# Patient Record
Sex: Female | Born: 1960
Health system: Southern US, Community
[De-identification: ages and names within clinical notes are randomized; demographics above are authoritative.]

## PROBLEM LIST (undated history)

## (undated) DIAGNOSIS — E119 Type 2 diabetes mellitus without complications: Secondary | ICD-10-CM

## (undated) DIAGNOSIS — I10 Essential (primary) hypertension: Secondary | ICD-10-CM

## (undated) HISTORY — DX: Essential (primary) hypertension: I10

---

## 2015-02-14 ENCOUNTER — Encounter (HOSPITAL_COMMUNITY): Payer: Self-pay | Admitting: *Deleted

## 2015-02-14 ENCOUNTER — Emergency Department (HOSPITAL_COMMUNITY): Payer: Self-pay

## 2015-02-14 ENCOUNTER — Emergency Department (HOSPITAL_COMMUNITY)
Admission: EM | Admit: 2015-02-14 | Discharge: 2015-02-14 | Disposition: A | Discharge status: Home / Self Care | Payer: Self-pay | Attending: Emergency Medicine | Admitting: Emergency Medicine

## 2015-02-14 DIAGNOSIS — X58XXXA Exposure to other specified factors, initial encounter: Secondary | ICD-10-CM | POA: Insufficient documentation

## 2015-02-14 DIAGNOSIS — R1012 Left upper quadrant pain: Secondary | ICD-10-CM | POA: Insufficient documentation

## 2015-02-14 DIAGNOSIS — S29011A Strain of muscle and tendon of front wall of thorax, initial encounter: Secondary | ICD-10-CM | POA: Insufficient documentation

## 2015-02-14 DIAGNOSIS — R059 Cough, unspecified: Secondary | ICD-10-CM

## 2015-02-14 DIAGNOSIS — Z791 Long term (current) use of non-steroidal anti-inflammatories (NSAID): Secondary | ICD-10-CM | POA: Insufficient documentation

## 2015-02-14 DIAGNOSIS — Y939 Activity, unspecified: Secondary | ICD-10-CM | POA: Insufficient documentation

## 2015-02-14 DIAGNOSIS — R05 Cough: Secondary | ICD-10-CM | POA: Insufficient documentation

## 2015-02-14 DIAGNOSIS — Y999 Unspecified external cause status: Secondary | ICD-10-CM | POA: Insufficient documentation

## 2015-02-14 DIAGNOSIS — Y929 Unspecified place or not applicable: Secondary | ICD-10-CM | POA: Insufficient documentation

## 2015-02-14 LAB — CBC WITH DIFFERENTIAL/PLATELET
BASOS ABS: 0 10*3/uL (ref 0.0–0.1)
BASOS PCT: 0 % (ref 0–1)
EOS ABS: 0.2 10*3/uL (ref 0.0–0.7)
EOS PCT: 4 % (ref 0–5)
HCT: 38.8 % (ref 36.0–46.0)
HEMOGLOBIN: 12.7 g/dL (ref 12.0–15.0)
LYMPHS ABS: 2.2 10*3/uL (ref 0.7–4.0)
Lymphocytes Relative: 44 % (ref 12–46)
MCH: 27.6 pg (ref 26.0–34.0)
MCHC: 32.7 g/dL (ref 30.0–36.0)
MCV: 84.3 fL (ref 78.0–100.0)
Monocytes Absolute: 0.5 10*3/uL (ref 0.1–1.0)
Monocytes Relative: 9 % (ref 3–12)
NEUTROS PCT: 43 % (ref 43–77)
Neutro Abs: 2.2 10*3/uL (ref 1.7–7.7)
PLATELETS: 244 10*3/uL (ref 150–400)
RBC: 4.6 MIL/uL (ref 3.87–5.11)
RDW: 13.1 % (ref 11.5–15.5)
WBC: 5 10*3/uL (ref 4.0–10.5)

## 2015-02-14 LAB — COMPREHENSIVE METABOLIC PANEL
ALBUMIN: 4.3 g/dL (ref 3.5–5.0)
ALK PHOS: 68 U/L (ref 38–126)
ALT: 31 U/L (ref 14–54)
AST: 20 U/L (ref 15–41)
Anion gap: 6 (ref 5–15)
BUN: 14 mg/dL (ref 6–20)
CALCIUM: 8.9 mg/dL (ref 8.9–10.3)
CHLORIDE: 106 mmol/L (ref 101–111)
CO2: 26 mmol/L (ref 22–32)
CREATININE: 0.63 mg/dL (ref 0.44–1.00)
GFR calc non Af Amer: >60 mL/min (ref >60)
GLUCOSE: 193 mg/dL — AB (ref 65–99)
Potassium: 4 mmol/L (ref 3.5–5.1)
SODIUM: 138 mmol/L (ref 135–145)
Total Bilirubin: 0.6 mg/dL (ref 0.3–1.2)
Total Protein: 7 g/dL (ref 6.5–8.1)

## 2015-02-14 LAB — URINALYSIS, ROUTINE W REFLEX MICROSCOPIC
Bilirubin Urine: NEGATIVE
GLUCOSE, UA: 100 mg/dL — AB
Ketones, ur: NEGATIVE mg/dL
LEUKOCYTES UA: NEGATIVE
Nitrite: NEGATIVE
PROTEIN: NEGATIVE mg/dL
SPECIFIC GRAVITY, URINE: 1.01 (ref 1.005–1.030)
UROBILINOGEN UA: 0.2 mg/dL (ref 0.0–1.0)
pH: 6 (ref 5.0–8.0)

## 2015-02-14 LAB — URINE MICROSCOPIC-ADD ON

## 2015-02-14 LAB — LIPASE, BLOOD: Lipase: 22 U/L (ref 22–51)

## 2015-02-14 MED ORDER — METHOCARBAMOL 500 MG PO TABS: 500.0000 mg | ORAL_TABLET | Freq: Two times a day (BID) | ORAL | Status: DC

## 2015-02-14 MED ORDER — METHOCARBAMOL 500 MG PO TABS
500.0000 mg | ORAL_TABLET | Freq: Once | ORAL | Status: AC
  Administered 2015-02-14: 500 mg via ORAL
  Filled 2015-02-14: qty 1

## 2015-02-14 MED ORDER — BENZONATATE 100 MG PO CAPS: 100.0000 mg | ORAL_CAPSULE | Freq: Three times a day (TID) | ORAL | Status: DC

## 2015-02-14 MED ORDER — NAPROXEN 500 MG PO TABS: 500.0000 mg | ORAL_TABLET | Freq: Two times a day (BID) | ORAL | Status: DC

## 2015-02-14 NOTE — Discharge Instructions (Signed)
1. Medications: robaxin, naprosyn, tessalon, usual home medications 2. Treatment: rest, drink plenty of fluids, advance diet slowly 3. Follow Up: Please followup with your primary doctor in 2 days for discussion of your diagnoses and further evaluation after today's visit; if you do not have a primary care doctor use the resource guide provided to find one; Please return to the ER for persistent vomiting, high fevers or worsening symptoms    Tos - Adultos  (Cough, Adult)  La tos es un reflejo que ayuda a limpiar las vas areas y Administrator. Puede ayudar a curar el organismo o ser Neomia Dear reaccin a un irritante. La tos puede durar [employer] 2  3 semanas (aguda) o puede durar ms de 8 semanas (crnica)  CAUSAS  Tos aguda:   Infecciones virales o bacterianas. Tos crnica.   Infecciones.  Alergias.  Asma.  Goteo post nasal.  El hbito de fumar.  Acidez o reflujo gstrico.  Algunos medicamentos.  Problemas pulmonares crnicos  Cncer. SNTOMAS   Tos.  Grant Ruts.  Dolor en el pecho.  Aumento en el ritmo respiratorio.  Ruidos agudos al respirar (sibilancias).  Moco coloreado al toser (esputo). TRATAMIENTO   Un tos de causa bacteriana puede tratarse con antibiticos.  La tos de origen viral debe seguir su curso y no responde a los antibiticos.  El mdico podr recomendar otros tratamientos si tiene tos crnica. INSTRUCCIONES PARA EL CUIDADO EN EL HOGAR   Solo tome medicamentos que se pueden comprar sin receta o recetados para Chief Technology Officer, Dentist o fiebre, como le indica el mdico. Utilice antitusivos slo en la forma indicada por el mdico.  Use un vaporizador o humidificador de niebla fra en la habitacin para ayudar a aflojar las secreciones.  Duerma en posicin semi erguida si la tos empeora por la noche.  Descanse todo lo que pueda.  Si fuma, abandone el hbito. SOLICITE ATENCIN MDICA DE INMEDIATO SI:   Observa pus en el esputo.  La tos empeora.  No puede  controlar la tos con antitusivos y no puede dormir debido a Secretary/administrator.  Comienza a escupir sangre al toser.  Tiene dificultad para respirar.  El dolor empeora o no puede controlarlo con los medicamentos.  Tiene fiebre. ASEGRESE DE QUE:   Comprende estas instrucciones.  Controlar su enfermedad.  Solicitar ayuda de inmediato si no mejora o si empeora. Document Released: 01/07/2011 Document Revised: 08/24/2011 [facility] Patient Information 2015 [place], [place]. This information is not intended to replace advice given to you by your health care provider. Make sure you discuss any questions you have with your health care provider.   Emergency Department Resource Guide 1) Find a Doctor and Pay Out of Pocket Although you won't have to find out who is covered by your insurance plan, it is a good idea to ask around and get recommendations. You will then need to call the office and see if the doctor you have chosen will accept you as a new patient and what types of options they offer for patients who are self-pay. Some doctors offer discounts or will set up payment plans for their patients who do not have insurance, but you will need to ask so you aren't surprised when you get to your appointment.  2) Contact Your Local Health Department Not all health departments have doctors that can see patients for sick visits, but many do, so it is worth a call to see if yours does. If you don't know where your local health department is, you can  check in your phone book. The CDC also has a tool to help you locate your state's health department, and many state websites also have listings of all of their local health departments.  3) Find a Walk-in Clinic If your illness is not likely to be very severe or complicated, you may want to try a walk in clinic. These are popping up all over the country in pharmacies, drugstores, and shopping centers. They're usually staffed by nurse practitioners or physician assistants  that have been trained to treat common illnesses and complaints. They're usually fairly quick and inexpensive. However, if you have serious medical issues or chronic medical problems, these are probably not your best option.  No Primary Care Doctor: - Call Health Connect at  [phone] - they can help you locate a primary care doctor that  accepts your insurance, provides certain services, etc. - Physician Referral Service- [phone]  Chronic Pain Problems: Organization         Address  Phone   Notes  [facility]  [phone] Patients need to be referred by their primary care doctor.   Medication Assistance: Organization         Address  Phone   Notes  [facility] Medication [facility] [address] [place] [place], [place] [address] [phone] --Must be a resident of [facility] -- Must have NO insurance coverage whatsoever (no Medicaid/ Medicare, etc.) -- The pt. MUST have a primary care doctor that directs their care regularly and follows them in the community   MedAssist  [phone]   [employer]  [phone]    Agencies that provide inexpensive medical care: Organization         Address  Phone   Notes  [facility]  [phone]   [facility]    [phone]   [facility] [address] [place], [place] [address] [phone]   [facility] [address] [place] [place] [phone]   Planned Parenthood    [phone]   [facility]    [phone]   [facility]  [address], [place] Phone:  [phone], Fax:  [phone] Hours of Operation:  9 am - 6 pm, M-F.  Also accepts Medicaid/Medicare and self-pay.  [facility]  [address], [place] Phone: [phone], Fax: [phone]. Hours of Operation:  8:30 am - 5:30 pm, M-F.  Also accepts Medicaid  and self-pay.  [facility] [place] [address] [place] Phone: [phone]   [facility] [address] Natasha Bence [place], [place] [phone], Ext. [phone] Mondays & Thursdays: 7-9 AM.  First 15 patients are seen on a first come, first serve basis.    Medicaid-accepting [facility] Providers:  Organization         Address  Phone   Notes  [facility] [address], [place] [phone] Also accepts self-pay patients.  [facility] [address] Laurell Josephs [place], [place]  [phone]   [facility] [address], [place] [phone]   [facility] [address] [place] [phone]   Renaye Rakers [address], [place]   [phone] Only accepts [place] Access [place] patients after they have their name applied to  their card.   Self-Pay (no insurance) in [facility]:  Organization         Address  Phone   Notes  Sickle Cell Patients, [facility] Internal Medicine [address] [place], [place] [phone]   [facility] [address] [place], [place] [phone]   [facility] [place]  [address], [place] [phone]   [facility]/Dr. Osei-Bonsu  [address], [place] or [address], [place] [phone] Phone number for both [place] and [place] locations is the same.  [facility] [address], [place] [phone]   [facility] [address] [place] or [address] [phone]   [facility] [address], [place] [phone], phone; [phone], fax Sees patients 1st and 3rd Saturday of every month.  Must not qualify for public or private insurance (i.e. Medicaid, Medicare, [place] Health Choice, Veterans' Benefits)  Household income  should be no more than 200% of the poverty level The clinic cannot treat you if you are pregnant or think you are pregnant  Sexually transmitted diseases are not treated at the clinic.    Dental Care: Organization         Address  Phone  Notes  [facility] Department of [facility] [address] [place], [place] [phone] Accepts children up to age 34 who are enrolled in [place] or [place] Health Choice; pregnant women with a Medicaid card; and children who have applied for Medicaid or [place] Health Choice, but were declined, whose parents can pay a reduced fee at time of service.  [facility] Department of [facility]  [address], [place] [phone] Accepts children up to age 66 who are enrolled in [place] or [place] Health Choice; pregnant women with a Medicaid card; and children who have applied for Medicaid or [place] Health Choice, but were declined, whose parents can pay a reduced fee at time of service.  [facility]  [address] [place], [place] [phone] Patients are seen by appointment only. Walk-ins are not accepted. [facility] will see patients 19 years of age and older. Monday - Tuesday (8am-5pm) Most Wednesdays (8:30-5pm) $30 per visit, cash only  [facility] Adult Dental Access PROGRAM  [address], [facility] [phone] Patients are seen by appointment only. Walk-ins are not accepted. [facility] will see patients 50 years of age and older. One Wednesday Evening (Monthly: Volunteer Based).  $30 per visit, cash only  [employer]  [phone] for adults; Children under age 34, call Graduate Pediatric Dentistry at [phone]. Children aged 45-14, please call [phone] to request a pediatric application.  Dental services are provided in all areas of dental care including fillings, crowns and bridges, complete and partial dentures, implants, gum treatment,  root canals, and extractions. Preventive care is also provided. Treatment is provided to both adults and children. Patients are selected via a lottery and there is often a waiting list.   [facility] [address], [place]  [phone] [email or web address]   [facility] [address] [place], [place] [phone], Ext. [phone] Second and Fourth Thursday of each month, opens at 6:30 AM; Clinic ends at 9 AM.  Patients are seen on a first-come first-served basis,  and a limited number are seen during each clinic.   [facility]  [address] Ether Griffins [place], [place] [phone]   Eligibility Requirements You must have lived in [place], [place], or [place] counties for at least the last three months.   You cannot be eligible for state or federal sponsored [place], including [employer], [place], or [employer].   You generally cannot be eligible for healthcare insurance through your employer.    How to apply: Eligibility screenings are held every Tuesday and Wednesday afternoon from 1:00 pm until 4:00 pm. You do not need an appointment for the interview!  [facility] [address] [place], [place] [phone]   [facility] Health Department  [phone]   [facility] Health Department  [phone]   [facility] Health Department  [phone]    Behavioral Health Resources in the Community: Intensive Outpatient Programs Organization         Address  Phone  Notes  [facility] Services [address], [place], [place] [phone]   [facility] Outpatient [address] [place], [place] [phone]   [facility] [address], [place], [place]  [phone]   [facility] Mental Health [address]  [place], [place] [phone] or [phone]   Substance Abuse Resources Organization         Address  Phone  Notes  [facility]   [phone]   [facility]  [phone]   [facility]  [phone]   Floydene Flock  [phone]   Residential & Outpatient Substance Abuse Program  [phone]   Psychological Services Organization         Address  Phone  Notes  [facility] Behavioral Health  [phone]   [facility] Services  [phone]   [facility] Mental Health [address], [place] [phone] or [phone]    Mobile Crisis Teams Organization         Address  Phone  Notes  [facility], Mobile Crisis Care Unit  [phone]   [facility]  [address] [place], [place] [phone]   Doristine Locks [address] [place] [phone]    Self-Help/Support Groups Organization         Address  Phone             Notes  [facility] [place] - variety of support groups  336- I7437963 Call for more information  Narcotics Anonymous (NA), [facility] [address], [employer] [place]  2 meetings at this location   Statistician         Address  Phone  Notes  [facility] [address],    [place]  [phone]   [facility]  [address], [place] [address], [place], [place] [phone]   [facility] [address] [place], [place] [phone] Admissions: 8am-3pm M-F  [facility] [address]    [place], [place] [phone]   [facility] [address] Starling Manns [place], [place] [phone]   [facility] [address]  [place], [place] [phone]   [facility] - Intensive Outpatient [address] Laurell Josephs [address], [place], [place] [phone]   [facility] ([facility] [address] [place].,  [place], [place] [phone] or [phone]   [facility] ([facility]) [address], [place], [place] [phone] Accepts Medicaid  [facility]ellowship  [address],  [place] [phone] Substance Abuse/Addiction Treatment   [facility] Organization         Address  Phone  Notes  [facility]  [phone]   Angie Fava, PhD [address] Ervin Knack [place], [place]   [phone] or [phone]   [facility] Behavioral   [address] [place], [place] [phone]   [facility] [address] [place], [place] [phone] Insurance/Medicaid/sponsorship through [facility] [address]                                    [place], [place] [phone] Therapy/tele-psych/case  [facility] [address], [place] [phone]    Dr. Lolly Mustache  [phone]   [facility]  [employer] [facility] Dept. 1) [address], [place] 2) [address], [place] 3)  [address], [place] [phone]  [phone]   [place] Child Abuse Hotline 714-202-0308 or [phone] (After Hours)

## 2015-02-14 NOTE — ED Notes (Signed)
Nurse currently starting IV 

## 2015-02-14 NOTE — ED Notes (Addendum)
Pt complains of LUQ abdominal pain since Saturday. Pt denies n/v/d. Pt has taken naproxen for pain, which provided some relief. Pt states the pain is worse with movement. Pt states the pain started after the patient had been coughing more over the weekend.

## 2015-02-14 NOTE — ED Notes (Signed)
Patient given Sprite and crackers for po challenge.

## 2015-02-14 NOTE — ED Notes (Signed)
WENT TO COLLECT LAPS RN STATED THEY WOULD COLLECT AS THEY WERE DOING AN IV

## 2015-02-14 NOTE — ED Notes (Signed)
Patient transported to X-ray 

## 2015-02-14 NOTE — Progress Notes (Signed)
CM spoke with pt who confirms uninsured [employer] resident with no pcp.  CM discussed and provided written spanish information for uninsured accepting pcps, discussed the importance of pcp vs EDP services for f/u care, www.needymeds.org, www.goodrx.com, discounted pharmacies and other [employer] such as [employer] , [employer], affordable care act, financial assistance, uninsured dental services, [place] med assist, DSS and  health department  Reviewed resources for [employer] uninsured accepting pcps like Jovita Kussmaul, family medicine at [employer], [facility] [place], [facility], local urgent care centers, [facility], [facility] family practice, general medical clinics, family services of the [place], [facility] urgent care plus others, medication resources, [facility] out patient pharmacies and housing Pt voiced understanding and appreciation of resources provided   Provided [facility] contact information Pt agreed to a referral Cm completed referral Pt to be contact by [facility] clinical liason

## 2015-02-14 NOTE — ED Provider Notes (Signed)
CSN: [id number]     Arrival date & time 02/14/15  1229 History   First MD Initiated Contact with Patient 02/14/15 1232     Chief Complaint  Patient presents with  . Abdominal Pain     (Consider location/radiation/quality/duration/timing/severity/associated sxs/prior Treatment) Patient is a 54 y.o. female presenting with abdominal pain. The history is provided by the patient and medical records. A language interpreter was used (Patient's daughter).  Abdominal Pain Associated symptoms: no chest pain, no constipation, no cough, no diarrhea, no dysuria, no fatigue, no fever, no hematuria, no nausea, no shortness of breath and no vomiting      Deborah Lyons is a 54 y.o. female  with no major medical history presents to the Emergency Department complaining of gradual, persistent, progressively worsening left flank and left upper abdominal pain onset 5 days ago with associated cough. Pt's daughter reports that LUQ abd pain and left-sided rib pain began after 24 hours of cough.  She reports the patient has taken Naproxyn with relief of the pain, but pain returns after it wears off.  Patient denies fevers, chills, decreased appetite, nausea, vomiting, diarrhea, weakness, dizziness, syncope, dysuria, hematuria, vaginal discharge.  Patient also denies chest pain, dyspnea on exertion, shortness of breath, recent travel, tick bites, abdominal surgeries, melanoma, hematochezia.  Patient's daughter is at bedside translating.  History reviewed. No pertinent past medical history. History reviewed. No pertinent past surgical history. No family history on file. Social History  Substance Use Topics  . Smoking status: Never Smoker   . Smokeless tobacco: None  . Alcohol Use: No   OB History    No data available     Review of Systems  Constitutional: Negative for fever, diaphoresis, appetite change, fatigue and unexpected weight change.  HENT: Negative for mouth sores.   Eyes: Negative for visual  disturbance.  Respiratory: Negative for cough, chest tightness, shortness of breath and wheezing.   Cardiovascular: Negative for chest pain.  Gastrointestinal: Positive for abdominal pain (LUQ). Negative for nausea, vomiting, diarrhea and constipation.  Endocrine: Negative for polydipsia, polyphagia and polyuria.  Genitourinary: Negative for dysuria, urgency, frequency and hematuria.  Musculoskeletal: Negative for back pain and neck stiffness.  Skin: Negative for rash.  Allergic/Immunologic: Negative for immunocompromised state.  Neurological: Negative for syncope, light-headedness and headaches.  Hematological: Does not bruise/bleed easily.  Psychiatric/Behavioral: Negative for sleep disturbance. The patient is not nervous/anxious.       Allergies  Review of patient's allergies indicates not on file.  Home Medications   Prior to Admission medications   Medication Sig Start Date End Date Taking? Authorizing Provider  acetaminophen (TYLENOL) 500 MG tablet Take 1,000 mg by mouth every 6 (six) hours as needed.   Yes Historical Provider, MD  naproxen (NAPROSYN) 250 MG tablet Take 500 mg by mouth 2 (two) times daily with a meal.   Yes Historical Provider, MD   BP 142/71 mmHg  Pulse 66  Temp(Src) 97.7 F (36.5 C) (Oral)  Resp 20  SpO2 97% Physical Exam  Constitutional: She appears well-developed and well-nourished. No distress.  Awake, alert, nontoxic appearance  HENT:  Head: Normocephalic and atraumatic.  Mouth/Throat: Oropharynx is clear and moist. No oropharyngeal exudate.  Eyes: Conjunctivae are normal. No scleral icterus.  Neck: Normal range of motion. Neck supple.  Cardiovascular: Normal rate, regular rhythm, normal heart sounds and intact distal pulses.   Pulmonary/Chest: Effort normal and breath sounds normal. No respiratory distress. She has no wheezes. She exhibits tenderness.  Equal chest  expansion Mild TTP of the left chest wall over ribs 9-13 in the anterior and  lateral   Abdominal: Soft. Bowel sounds are normal. She exhibits no distension and no mass. There is tenderness in the left upper quadrant. There is no rebound and no guarding.    Musculoskeletal: Normal range of motion. She exhibits no edema.  Neurological: She is alert.  Speech is clear and goal oriented Moves extremities without ataxia  Skin: Skin is warm and dry. She is not diaphoretic.  Psychiatric: She has a normal mood and affect.  Nursing note and vitals reviewed.   ED Course  Procedures (including critical care time) Labs Review Labs Reviewed  COMPREHENSIVE METABOLIC PANEL - Abnormal; Notable for the following:    Glucose, Bld 193 (*)    All other components within normal limits  URINALYSIS, ROUTINE W REFLEX MICROSCOPIC (NOT AT [facility]) - Abnormal; Notable for the following:    Glucose, UA 100 (*)    Hgb urine dipstick TRACE (*)    All other components within normal limits  URINE MICROSCOPIC-ADD ON - Abnormal; Notable for the following:    Bacteria, UA FEW (*)    All other components within normal limits  CBC WITH DIFFERENTIAL/PLATELET  LIPASE, BLOOD    Imaging Review Dg Chest 2 View  02/14/2015   CLINICAL DATA:  Cough for 1 month. Left upper quadrant pain since Saturday. History of bronchitis. Nonsmoker.  EXAM: CHEST  2 VIEW  COMPARISON:  None.  FINDINGS: Patient rotated to the right. Midline trachea. Normal heart size and mediastinal contours. No pleural effusion or pneumothorax. Clear lungs.  IMPRESSION: No acute cardiopulmonary disease.   Electronically Signed   By: Jeronimo Greaves M.D.   On: 02/14/2015 13:32   I have personally reviewed and evaluated these images and lab results as part of my medical decision-making.   EKG Interpretation None      MDM   Final diagnoses:  Chest wall muscle strain, initial encounter  Cough  LUQ abdominal pain   Deborah Lyons presents with LUQ abd pain and left rib pain after coughing.  Will check basic labs and chest  x-ray. Will give muscle relaxer and reassess.  Patient is without tachycardia, hypotension, hypoxia or fever.  Patient denies reflux symptoms.  3:24 PM Labs are reassuring and without leukocytosis. No evidence of pancreatitis. No right upper quadrant abdominal pain and no lab evidence of cholecystitis.  No splenomegaly on exam. Urinalysis without evidence of urinary tract infection.  His x-ray without evidence of pneumothorax, rib fracture, pleural effusion or pneumonia.  Pain is likely secondary to muscle strain from her persistent coughing. Will give muscle relaxer, anti-inflammatory cell cough medicine. Discussed at length with patient and family the importance of having a primary care and follow-up. Patient given resources and information about the [facility]  BP 142/71 mmHg  Pulse 66  Temp(Src) 97.7 F (36.5 C) (Oral)  Resp 20  SpO2 97%    Deborah Forth, PA-C 02/14/15 1526  Deborah Grizzle, MD 02/15/15 [phone]

## 2016-04-07 ENCOUNTER — Ambulatory Visit (HOSPITAL_COMMUNITY)
Admission: EM | Admit: 2016-04-07 | Discharge: 2016-04-07 | Disposition: A | Discharge status: Home / Self Care | Payer: Self-pay | Attending: Family Medicine | Admitting: Family Medicine

## 2016-04-07 ENCOUNTER — Ambulatory Visit (INDEPENDENT_AMBULATORY_CARE_PROVIDER_SITE_OTHER): Payer: Self-pay

## 2016-04-07 ENCOUNTER — Encounter (HOSPITAL_COMMUNITY): Payer: Self-pay | Admitting: Emergency Medicine

## 2016-04-07 DIAGNOSIS — S93492A Sprain of other ligament of left ankle, initial encounter: Secondary | ICD-10-CM

## 2016-04-07 NOTE — ED Triage Notes (Signed)
[facility] with a complaint of left ankle pain that started this morning secondary to missing a step and rolling her ankle.

## 2016-04-07 NOTE — ED Provider Notes (Signed)
CSN: [id number]     Arrival date & time 04/07/16  1652 History   First MD Initiated Contact with Patient 04/07/16 1746     Chief Complaint  Patient presents with  . Ankle Pain   (Consider location/radiation/quality/duration/timing/severity/associated sxs/prior Treatment) HPI NP 55 Y/O FEMALE WITH LEFT ANKLE INJURY AFTER MISSING A STEP CAUSING HER TO ROLL HER ANKLE. PAIN AND SWELLING. HOME TREATMENT ICE, ELEVATION AND REST.  History reviewed. No pertinent past medical history. History reviewed. No pertinent surgical history. History reviewed. No pertinent family history. Social History  Substance Use Topics  . Smoking status: Never Smoker  . Smokeless tobacco: Never Used  . Alcohol use No   OB History    No data available     Review of Systems  Denies: HEADACHE, NAUSEA, ABDOMINAL PAIN, CHEST PAIN, CONGESTION, DYSURIA, SHORTNESS OF BREATH  Allergies  Review of patient's allergies indicates no known allergies.  Home Medications   Prior to Admission medications   Medication Sig Start Date End Date Taking? Authorizing Provider  acetaminophen (TYLENOL) 500 MG tablet Take 1,000 mg by mouth every 6 (six) hours as needed.    Historical Provider, MD  benzonatate (TESSALON) 100 MG capsule Take 1 capsule (100 mg total) by mouth every 8 (eight) hours. 02/14/15   Hannah Muthersbaugh, PA-C  methocarbamol (ROBAXIN) 500 MG tablet Take 1 tablet (500 mg total) by mouth 2 (two) times daily. 02/14/15   Hannah Muthersbaugh, PA-C  naproxen (NAPROSYN) 500 MG tablet Take 1 tablet (500 mg total) by mouth 2 (two) times daily with a meal. 02/14/15   Hannah Muthersbaugh, PA-C   Meds Ordered and Administered this Visit  Medications - No data to display  BP 143/76 (BP Location: Right Arm)   Pulse 71   Temp 98.3 F (36.8 C) (Oral)   Resp 20   SpO2 100%  No data found.   Physical Exam NURSES NOTES AND VITAL SIGNS REVIEWED. CONSTITUTIONAL: Well developed, well nourished, no acute distress HEENT:  normocephalic, atraumatic EYES: Conjunctiva normal NECK:normal ROM, supple, no adenopathy PULMONARY:No respiratory distress, normal effort ABDOMINAL: Soft, ND, NT BS+, No CVAT MUSCULOSKELETAL: Normal ROM of all extremities, LEFT ANKLE WITH SOME SWELLING LATERALLY, NO 5MTT. SKIN: warm and dry without rash PSYCHIATRIC: Mood and affect, behavior are normal  Urgent Care Course   Clinical Course    Procedures (including critical care time)  Labs Review Labs Reviewed - No data to display  Imaging Review Dg Ankle Complete Left  Result Date: 04/07/2016 CLINICAL DATA:  Left ankle pain since a twisting injury going down stairs earlier today. Initial encounter. EXAM: LEFT ANKLE COMPLETE - 3+ VIEW COMPARISON:  None. FINDINGS: There is no evidence of fracture, dislocation, or joint effusion. There is no evidence of arthropathy or other focal bone abnormality. Small calcaneal spurs are noted. Soft tissues are unremarkable. IMPRESSION: Negative exam. Electronically Signed   By: Drusilla Kannerhomas  Dalessio M.D.   On: 04/07/2016 18:00     Visual Acuity Review  Right Eye Distance:   Left Eye Distance:   Bilateral Distance:    Right Eye Near:   Left Eye Near:    Bilateral Near:         MDM   1. Sprain of anterior talofibular ligament of left ankle, initial encounter     Patient is reassured that there are no issues that require transfer to higher level of care at this time or additional tests. Patient is advised to continue home symptomatic treatment. Patient is advised that if there are  new or worsening symptoms to attend the emergency department, contact primary care provider, or return to UC. Instructions of care provided discharged home in stable condition.    THIS NOTE WAS GENERATED USING A VOICE RECOGNITION SOFTWARE PROGRAM. ALL REASONABLE EFFORTS  WERE MADE TO PROOFREAD THIS DOCUMENT FOR ACCURACY.  I have verbally reviewed the discharge instructions with the patient. A printed AVS was  given to the patient.  All questions were answered prior to discharge.     Tharon Aquas, PA 04/07/16 1816

## 2016-04-16 ENCOUNTER — Encounter (HOSPITAL_COMMUNITY): Payer: Self-pay | Admitting: Emergency Medicine

## 2016-04-16 ENCOUNTER — Ambulatory Visit (HOSPITAL_COMMUNITY)
Admission: EM | Admit: 2016-04-16 | Discharge: 2016-04-16 | Disposition: A | Discharge status: Home / Self Care | Payer: Self-pay | Attending: Emergency Medicine | Admitting: Emergency Medicine

## 2016-04-16 DIAGNOSIS — N644 Mastodynia: Secondary | ICD-10-CM

## 2016-04-16 MED ORDER — NAPROXEN 500 MG PO TABS: 500.0000 mg | ORAL_TABLET | Freq: Two times a day (BID) | ORAL | 0 refills | Status: DC

## 2016-04-16 NOTE — ED Provider Notes (Signed)
[facility]    CSN: [id number] Arrival date & time: 04/16/16  1839     History   Chief Complaint Chief Complaint  Patient presents with  . Breast Pain    HPI Deborah Lyons is a 55 y.o. female.   HPI  She is a 55 year old woman here for evaluation of left nipple pain. Her daughter is present and acts as interpreter.  For the last month she has had intermittent pains in the left nipple. No discharge. No skin changes. No masses appreciated. No fevers. Her last mammogram was at least 3 years ago.  History reviewed. No pertinent past medical history.  There are no active problems to display for this patient.   History reviewed. No pertinent surgical history.  OB History    No data available       Home Medications    Prior to Admission medications   Medication Sig Start Date End Date Taking? Authorizing Provider  acetaminophen (TYLENOL) 500 MG tablet Take 1,000 mg by mouth every 6 (six) hours as needed.    Historical Provider, MD  naproxen (NAPROSYN) 500 MG tablet Take 1 tablet (500 mg total) by mouth 2 (two) times daily. 04/16/16   Charm RingsErin J Evian Derringer, MD    Family History History reviewed. No pertinent family history.  Social History Social History  Substance Use Topics  . Smoking status: Never Smoker  . Smokeless tobacco: Never Used  . Alcohol use No     Allergies   Review of patient's allergies indicates no known allergies.   Review of Systems Review of Systems As in history of present illness  Physical Exam Triage Vital Signs ED Triage Vitals  Enc Vitals Group     BP 04/16/16 1948 169/61     Pulse Rate 04/16/16 1948 65     Resp 04/16/16 1948 16     Temp 04/16/16 1948 98.2 F (36.8 C)     Temp Source 04/16/16 1948 Oral     SpO2 04/16/16 1948 100 %     Weight --      Height --      Head Circumference --      Peak Flow --      Pain Score 04/16/16 1953 3     Pain Loc --      Pain Edu? --      Excl. in GC? --    No data  found.   Updated Vital Signs BP 169/61 (BP Location: Left Arm)   Pulse 65   Temp 98.2 F (36.8 C) (Oral)   Resp 16   SpO2 100%   Visual Acuity Right Eye Distance:   Left Eye Distance:   Bilateral Distance:    Right Eye Near:   Left Eye Near:    Bilateral Near:     Physical Exam  Constitutional: She is oriented to person, place, and time. She appears well-developed and well-nourished. No distress.  Cardiovascular: Normal rate.   Pulmonary/Chest: Effort normal. Left breast exhibits tenderness. Left breast exhibits no inverted nipple, no mass, no nipple discharge and no skin change.    Neurological: She is alert and oriented to person, place, and time.     UC Treatments / Results  Labs (all labs ordered are listed, but only abnormal results are displayed) Labs Reviewed - No data to display  EKG  EKG Interpretation None       Radiology No results found.  Procedures Procedures (including critical care time)  Medications Ordered in  UC Medications - No data to display   Initial Impression / Assessment and Plan / UC Course  I have reviewed the triage vital signs and the nursing notes.  Pertinent labs & imaging results that were available during my care of the patient were reviewed by me and considered in my medical decision making (see chart for details).  Clinical Course    Prescription for Naprosyn given to use as needed for pain. I placed a future order for a mammogram. Patient is to call the breast center tomorrow to set up an appointment. If there are any difficulties, they should call the urgent care.  Final Clinical Impressions(s) / UC Diagnoses   Final diagnoses:  Nipple pain    New Prescriptions New Prescriptions   NAPROXEN (NAPROSYN) 500 MG TABLET    Take 1 tablet (500 mg total) by mouth 2 (two) times daily.     Charm RingsErin J Wylma Tatem, MD 04/16/16 2021

## 2016-04-16 NOTE — Discharge Instructions (Signed)
Take Naprosyn twice a day as needed to help with pain. I have set up a mammogram at the breast center. Please call them tomorrow to set up a time.  If there is any problem, please have them contact the urgent care at [phone].

## 2016-04-16 NOTE — ED Triage Notes (Signed)
Here for intermittent pain on left breast onset 3 days  Reports pain increases w/pressure  Denies nipple drainage, breast mass, fevers  Also c/o dry cough onset x5 days  A&O x4... NAD

## 2016-05-06 ENCOUNTER — Other Ambulatory Visit (HOSPITAL_COMMUNITY): Payer: Self-pay | Admitting: *Deleted

## 2016-05-06 DIAGNOSIS — N644 Mastodynia: Secondary | ICD-10-CM

## 2016-06-04 ENCOUNTER — Ambulatory Visit
Admission: RE | Admit: 2016-06-04 | Discharge: 2016-06-04 | Disposition: A | Discharge status: Home / Self Care | Payer: No Typology Code available for payment source | Source: Ambulatory Visit | Attending: Obstetrics and Gynecology | Admitting: Obstetrics and Gynecology

## 2016-06-04 ENCOUNTER — Ambulatory Visit (HOSPITAL_COMMUNITY)
Admission: RE | Admit: 2016-06-04 | Discharge: 2016-06-04 | Disposition: A | Discharge status: Home / Self Care | Payer: Self-pay | Source: Ambulatory Visit | Attending: Obstetrics and Gynecology | Admitting: Obstetrics and Gynecology

## 2016-06-04 ENCOUNTER — Encounter (HOSPITAL_COMMUNITY): Payer: Self-pay

## 2016-06-04 ENCOUNTER — Other Ambulatory Visit: Payer: Self-pay | Admitting: Obstetrics and Gynecology

## 2016-06-04 VITALS — BP 162/98 | Temp 98.2°F | Ht 60.0 in | Wt 151.4 lb

## 2016-06-04 DIAGNOSIS — N644 Mastodynia: Secondary | ICD-10-CM

## 2016-06-04 DIAGNOSIS — Z1239 Encounter for other screening for malignant neoplasm of breast: Secondary | ICD-10-CM

## 2016-06-04 DIAGNOSIS — Z1231 Encounter for screening mammogram for malignant neoplasm of breast: Secondary | ICD-10-CM

## 2016-06-04 NOTE — Patient Instructions (Signed)
Explained breast self awareness to Deborah EdingerMaria Avina de Lyons. Patient did not need a Pap smear today due to last Pap smear was in February 2017 per patient. Let her know BCCCP will cover Pap smears every 3 years unless has a history of abnormal Pap smears. [facility]. Appointment scheduled for Thursday, June 04, 2016 at 1430. Let patient know the Breast Center will follow up with her within the next couple weeks with results of mammogram by letter or phone. Deborah EdingerMaria Avina de Lyons verbalized understanding.  Rossy Virag, Kathaleen Maserhristine Poll, RN 2:49 PM

## 2016-06-04 NOTE — Progress Notes (Signed)
No complaints today.   Pap Smear:  Pap smear not completed today. Last Pap smear was in February 2017 at the free [facility] cancer screening [facility] and normal per patient. Per patient has no history of an abnormal Pap smear. No Pap smear results are in EPIC.  Physical exam: Breasts Breasts symmetrical. No skin abnormalities bilateral breasts. No nipple retraction bilateral breasts. No nipple discharge bilateral breasts. No lymphadenopathy. No lumps palpated bilateral breasts. No complaints of pain or tenderness on exam. [facility]. Appointment scheduled for Thursday, June 04, 2016 at 1430.        Pelvic/Bimanual No Pap smear completed today since last Pap smear was in February 2017 per patient. Pap smear not indicated per BCCCP guidelines.   Smoking History: Patient has never smoked.  Patient Navigation: Patient education provided. Access to services provided for patient through [facility] program. Spanish interpreter provided.  Colorectal Cancer Screening: Per patient has never had a colonoscopy completed. No complaints today.  Used Spanish interpreter [employer] Herrera from [facility].

## 2016-06-19 ENCOUNTER — Encounter (HOSPITAL_COMMUNITY): Payer: Self-pay | Admitting: *Deleted

## 2017-06-11 ENCOUNTER — Other Ambulatory Visit: Payer: Self-pay | Admitting: Obstetrics and Gynecology

## 2017-06-11 DIAGNOSIS — Z1231 Encounter for screening mammogram for malignant neoplasm of breast: Secondary | ICD-10-CM

## 2017-07-13 ENCOUNTER — Ambulatory Visit (HOSPITAL_COMMUNITY)
Admission: RE | Admit: 2017-07-13 | Discharge: 2017-07-13 | Disposition: A | Discharge status: Home / Self Care | Payer: No Typology Code available for payment source | Source: Ambulatory Visit | Attending: Obstetrics and Gynecology | Admitting: Obstetrics and Gynecology

## 2017-07-13 ENCOUNTER — Ambulatory Visit
Admission: RE | Admit: 2017-07-13 | Discharge: 2017-07-13 | Disposition: A | Discharge status: Home / Self Care | Payer: No Typology Code available for payment source | Source: Ambulatory Visit | Attending: Obstetrics and Gynecology | Admitting: Obstetrics and Gynecology

## 2017-07-13 ENCOUNTER — Encounter (HOSPITAL_COMMUNITY): Payer: Self-pay

## 2017-07-13 VITALS — BP 120/72 | Wt 142.0 lb

## 2017-07-13 DIAGNOSIS — Z1239 Encounter for other screening for malignant neoplasm of breast: Secondary | ICD-10-CM

## 2017-07-13 DIAGNOSIS — Z1231 Encounter for screening mammogram for malignant neoplasm of breast: Secondary | ICD-10-CM

## 2017-07-13 NOTE — Patient Instructions (Addendum)
Explained breast self awareness with [name]. Patient did not need a Pap smear today due to last Pap smear was in February 2017 per patient. Let her know BCCCP will cover Pap smears every 3 years unless has a history of abnormal Pap smears. [facility] screening mamm[facility]. Appointment scheduled for Tuesday, July 13, 2017 at 1400. Let patient know the Breast Center will follow up with her within the next couple weeks with results of mammogram by letter or phone. [name] verbalized understanding.  Josalin Carneiro, Kathaleen Maserhristine Poll, RN 12:40 PM

## 2017-07-13 NOTE — Progress Notes (Signed)
Patient complained of left breast pain that comes and goes for past 14-15 months. Patient stated she was having the pain prior to her last mammogram but did not have pain the day of her mammogram. Patient rates the pain at a 1-3 out of 10.  Pap Smear: Pap smear not completed today. Last Pap smear was in February 2017 at the free [facility] cancer screening [facility] and normal per patient. Per patient has no history of an abnormal Pap smear. No Pap smear results are in EPIC.  Physical exam: Breasts Breasts symmetrical. No skin abnormalities bilateral breasts. No nipple retraction bilateral breasts. No nipple discharge bilateral breasts. No lymphadenopathy. No lumps palpated bilateral breasts. No complaints of pain or tenderness on exam. [facility] mamm[facility]. Appointment scheduled for Tuesday, July 13, 2017 at 1400.       Pelvic/Bimanual No Pap smear completed today since last Pap smear was in February 2017 per patient. Pap smear not indicated per BCCCP guidelines.  Smoking History: Patient has never smoked.  Patient Navigation: Patient education provided. Access to services provided [facility]. Spanish interpreter provided.  Colorectal Cancer Screening: Per patient has never had a colonoscopy completed. No complaints today. FIT Test given to patient to complete and return to BCCCP.  Used Spanish interpreter [employer] McReynolds from [employer].

## 2017-07-14 ENCOUNTER — Encounter (HOSPITAL_COMMUNITY): Payer: Self-pay | Admitting: *Deleted

## 2018-05-05 ENCOUNTER — Other Ambulatory Visit (HOSPITAL_COMMUNITY): Payer: Self-pay | Admitting: *Deleted

## 2018-05-05 DIAGNOSIS — N644 Mastodynia: Secondary | ICD-10-CM

## 2018-06-30 ENCOUNTER — Ambulatory Visit: Payer: No Typology Code available for payment source

## 2018-06-30 ENCOUNTER — Ambulatory Visit
Admission: RE | Admit: 2018-06-30 | Discharge: 2018-06-30 | Disposition: A | Discharge status: Home / Self Care | Payer: No Typology Code available for payment source | Source: Ambulatory Visit | Attending: Obstetrics and Gynecology | Admitting: Obstetrics and Gynecology

## 2018-06-30 ENCOUNTER — Encounter (HOSPITAL_COMMUNITY): Payer: Self-pay

## 2018-06-30 ENCOUNTER — Encounter (HOSPITAL_COMMUNITY): Payer: Self-pay | Admitting: *Deleted

## 2018-06-30 ENCOUNTER — Other Ambulatory Visit: Payer: Self-pay | Admitting: Obstetrics and Gynecology

## 2018-06-30 ENCOUNTER — Ambulatory Visit (HOSPITAL_COMMUNITY)
Admission: RE | Admit: 2018-06-30 | Discharge: 2018-06-30 | Disposition: A | Discharge status: Home / Self Care | Payer: No Typology Code available for payment source | Source: Ambulatory Visit | Attending: Obstetrics and Gynecology | Admitting: Obstetrics and Gynecology

## 2018-06-30 VITALS — BP 122/80 | Wt 147.0 lb

## 2018-06-30 DIAGNOSIS — N644 Mastodynia: Secondary | ICD-10-CM

## 2018-06-30 DIAGNOSIS — Z1239 Encounter for other screening for malignant neoplasm of breast: Secondary | ICD-10-CM

## 2018-06-30 DIAGNOSIS — Z1231 Encounter for screening mammogram for malignant neoplasm of breast: Secondary | ICD-10-CM

## 2018-06-30 NOTE — Patient Instructions (Addendum)
Explained breast self awareness with Deborah Lyons. Patient did not need a Pap smear today due to last Pap smear was in February 2017 per patient. Let her know BCCCP will cover Pap smears every 3 years unless has a history of abnormal Pap smears. [facility] screening mamm[facility]. Appointment scheduled for Tuesday, July 19, 2018 at 1340. Patient scheduled for the free [facility] screening [facility] on Monday, August 08, 2018 at 1825. Patient aware of appointments and will be there. Deborah Lyons verbalized understanding.  Brannock, Kathaleen Maserhristine Poll, RN 8:40 AM

## 2018-06-30 NOTE — Progress Notes (Addendum)
No complaints today.   Pap Smear: Pap smear not completed today. Last Pap smear was in February 2017 at the free cervical cancer screening at [facility] and normal per patient. Per patient has no history of an abnormal Pap smear. Patient scheduled for the free cervical cancer screening at the [facility] cancer on Monday, August 08, 2018 at 1825. No Pap smear results are in EPIC.  Physical exam: Breasts Breasts symmetrical. No skin abnormalities bilateral breasts. No nipple retraction bilateral breasts. No nipple discharge bilateral breasts. No lymphadenopathy. No lumps palpated bilateral breasts. No complaints of pain or tenderness on exam. Referred patient to the [facility] for a screening mammogram. Appointment scheduled for Tuesday, July 19, 2018 at 1340.        Pelvic/Bimanual No Pap smear completed today since last Pap smear was in February 2017 per patient. Pap smear not indicated per BCCCP guidelines.    Smoking History: Patient has never smoked.  Patient Navigation: Patient education provided. Access to services provided for patient through [facility] program. Spanish interpreter provided.   Colorectal Cancer Screening: Per patient has never had a colonoscopy completed. No complaints today. FIT Test given to patient to complete and return to BCCCP.  Breast and Cervical Cancer Risk Assessment: Patient has a family history of her sister having breast cancer. Patient has no known genetic mutations or history of radiation treatment to the chest before age 63. Patient has no history of cervical dysplasia, immunocompromised, or DES exposure in-utero.  Risk Assessment    Risk Scores      06/30/2018   Last edited by: Lynnell Dike, LPN   5-year risk: 1.9 %   Lifetime risk: 10.4 %         Used Spanish interpreter Natale Lay from [place].

## 2018-06-30 NOTE — Addendum Note (Signed)
Encounter addended by: Priscille HeidelbergBrannock, Harrietta Incorvaia P, RN on: 06/30/2018 9:56 AM  Actions taken: Clinical Note Signed

## 2018-07-19 ENCOUNTER — Ambulatory Visit
Admission: RE | Admit: 2018-07-19 | Discharge: 2018-07-19 | Disposition: A | Discharge status: Home / Self Care | Payer: No Typology Code available for payment source | Source: Ambulatory Visit | Attending: Obstetrics and Gynecology | Admitting: Obstetrics and Gynecology

## 2018-07-19 ENCOUNTER — Other Ambulatory Visit: Payer: No Typology Code available for payment source

## 2018-07-19 DIAGNOSIS — Z1231 Encounter for screening mammogram for malignant neoplasm of breast: Secondary | ICD-10-CM

## 2018-07-22 ENCOUNTER — Other Ambulatory Visit: Payer: Self-pay

## 2018-07-24 LAB — SPECIMEN STATUS REPORT

## 2018-07-24 LAB — FECAL OCCULT BLOOD, IMMUNOCHEMICAL: FECAL OCCULT BLD: NEGATIVE

## 2018-07-25 ENCOUNTER — Encounter (HOSPITAL_COMMUNITY): Payer: Self-pay

## 2018-08-08 ENCOUNTER — Other Ambulatory Visit: Payer: Self-pay | Admitting: Nurse Practitioner

## 2018-08-08 DIAGNOSIS — Z124 Encounter for screening for malignant neoplasm of cervix: Secondary | ICD-10-CM

## 2018-08-08 NOTE — Addendum Note (Signed)
Addended by: Lynnell Dike on: 08/08/2018 07:31 PM   Modules accepted: Orders

## 2018-08-08 NOTE — Progress Notes (Signed)
Patient: Deborah Lyons           Date of Birth: 09-Jan-1961           MRN: [id number] Visit Date: 08/08/2018 PCP: Patient, No Pcp Per  Cervical Cancer Screening Do you smoke?: No Have you ever had or been told you have an allergy to latex products?: No Marital status: Single Date of last pap smear: Within the last year Date of last menstrual period: 06/15/13 Number of pregnancies: 4 Number of births: 4 Have you ever had any of the following? Hysterectomy: No Tubal ligation (tubes tied): No Abnormal bleeding: No Abnormal pap smear: No Venereal warts: No A sex partner with venereal warts: No A high risk* sex partner: No  Cervical Exam  Abnormal Observations: On first observation of cervix, seemed to have a cervical polyp in the center of the os, but then during the pap, the "polyp" area does not seem to be on a stalk but seems to be more attached posteriorly and so did not attempt removal. Recommendations: Follow up pap by guidelines.      Patient's History There are no active problems to display for this patient.  No past medical history on file.  Family History  Problem Relation Age of Onset  . Hypertension Mother   . Breast cancer Sister   . Stroke Brother     Past Surgical History:  Procedure Laterality Date  . CESAREAN SECTION     Social History   Occupational History  . Not on file  Tobacco Use  . Smoking status: Never Smoker  . Smokeless tobacco: Never Used  Substance and Sexual Activity  . Alcohol use: No  . Drug use: No  . Sexual activity: Not Currently    Birth control/protection: None

## 2018-08-10 LAB — CYTOLOGY - PAP: Diagnosis: NEGATIVE

## 2018-08-25 ENCOUNTER — Ambulatory Visit (HOSPITAL_COMMUNITY): Payer: No Typology Code available for payment source

## 2018-09-13 ENCOUNTER — Telehealth (HOSPITAL_COMMUNITY): Payer: Self-pay | Admitting: *Deleted

## 2018-09-13 NOTE — Telephone Encounter (Signed)
Called patient with Spanish interpreter Natale Lay from [facility] to discuss her cervical cancer screening results. Explained to patient that her Pap smear was normal and a cervical polyp was observed on exam. Patient has no insurance. Offered to refer patient to the [facility] [employer]. Told patient about the [facility] Financial assistance application. Patient would like a referral and verbalized understanding. Referral sent to the [facility] [employer].

## 2018-10-04 ENCOUNTER — Telehealth: Payer: Self-pay | Admitting: Student

## 2018-10-04 NOTE — Telephone Encounter (Signed)
Per the message in the inbox, the patient is to be scheduled for a cervicial polyp. Placing the patient on the wait list per the COVID19 restrictions.

## 2018-11-22 ENCOUNTER — Encounter: Payer: Self-pay | Admitting: *Deleted

## 2018-12-05 ENCOUNTER — Ambulatory Visit: Payer: No Typology Code available for payment source | Admitting: Obstetrics and Gynecology

## 2019-01-10 ENCOUNTER — Ambulatory Visit: Payer: Self-pay | Admitting: Obstetrics & Gynecology

## 2019-01-10 ENCOUNTER — Encounter: Payer: Self-pay | Admitting: Obstetrics & Gynecology

## 2019-01-10 ENCOUNTER — Other Ambulatory Visit: Payer: Self-pay

## 2019-01-10 VITALS — BP 157/83 | HR 76 | Wt 148.0 lb

## 2019-01-10 DIAGNOSIS — Z049 Encounter for examination and observation for unspecified reason: Secondary | ICD-10-CM

## 2019-01-10 NOTE — Progress Notes (Signed)
Spanish Interpreter Eda R. 

## 2019-01-10 NOTE — Progress Notes (Signed)
   Subjective:    Patient ID: Deborah Lyons, female    DOB: 04/30/1961, 58 y.o.   MRN: [id number]  HPI 58 yo lady sent from [facility] for a possible cervical polyp. She has no complaints today, denies PMB. Per records from [facility], she had a normal pap smear in 2017   Review of Systems     Objective:   Physical Exam Breathing, conversing, and ambulating normally Well nourished, well hydrated Latina, no apparent distress  Live Spanish interpretor used for exam Vulva and vagina with atrophy Cervix- atrophic with normal anatomy    Assessment & Plan:  Doing well.

## 2019-06-16 HISTORY — PX: OTHER SURGICAL HISTORY: SHX169

## 2019-09-13 ENCOUNTER — Other Ambulatory Visit: Payer: Self-pay

## 2019-09-13 DIAGNOSIS — Z1231 Encounter for screening mammogram for malignant neoplasm of breast: Secondary | ICD-10-CM

## 2019-10-10 ENCOUNTER — Ambulatory Visit: Payer: Self-pay

## 2019-11-07 ENCOUNTER — Other Ambulatory Visit: Payer: Self-pay

## 2019-11-07 ENCOUNTER — Ambulatory Visit: Payer: No Typology Code available for payment source | Admitting: *Deleted

## 2019-11-07 ENCOUNTER — Ambulatory Visit
Admission: RE | Admit: 2019-11-07 | Discharge: 2019-11-07 | Disposition: A | Discharge status: Home / Self Care | Payer: Self-pay | Source: Ambulatory Visit | Attending: Obstetrics and Gynecology | Admitting: Obstetrics and Gynecology

## 2019-11-07 VITALS — BP 150/82 | Temp 97.3°F | Wt 144.2 lb

## 2019-11-07 DIAGNOSIS — Z1239 Encounter for other screening for malignant neoplasm of breast: Secondary | ICD-10-CM

## 2019-11-07 DIAGNOSIS — Z1231 Encounter for screening mammogram for malignant neoplasm of breast: Secondary | ICD-10-CM

## 2019-11-07 NOTE — Progress Notes (Signed)
[name] is a 59 y.o. G4P0 female who presents to [facility] clinic today with complaints of pain between her breasts when touched. Patient rates the pain at a 2 out of 10.   Pap Smear: Pap smear completed today. Last Pap smear was 08/08/2018 at the free cervical cancer screening clinic sponsored by [facility] and was normal. Per patient has no history of an abnormal Pap smear. Last Pap smear result is in Epic.   Physical exam: Breasts Breasts symmetrical. No skin abnormalities bilateral breasts. No nipple retraction bilateral breasts. No nipple discharge bilateral breasts. No lymphadenopathy. No lumps palpated bilateral breasts. No complaints of pain or tenderness on exam.       Pelvic/Bimanual Ext Genitalia No lesions, no swelling and no discharge observed on external genitalia.        Vagina Vagina pink and normal texture. No lesions or discharge observed in vagina.        Cervix Cervix is present. Cervix pink and of normal texture. No discharge observed.    Uterus Uterus is present and palpable. Uterus in normal position and normal size.        Adnexae Bilateral ovaries present and palpable. No tenderness on palpation.         Rectovaginal No rectal exam completed today since patient had no rectal complaints. No skin abnormalities observed on exam.     Smoking History: Patient has never smoked.  Patient Navigation: Patient education provided. Access to services provided for patient through [place] program. Spanish interpreter Deborah Lyons from [facility] provided.   Colorectal Cancer Screening: Per patient has never had a colonoscopy completed. Patient completed a FIT Test 07/22/2018 that was negative. No complaints today.    Breast and Cervical Cancer Risk Assessment: Patient has a family history of her sister having breast cancer. Patient has no known genetic mutations or history of radiation treatment to the chest before age 6. Patient has no history  of cervical dysplasia, immunocompromised, or DES exposure in-utero.  Risk Assessment    Risk Scores      11/07/2019 06/30/2018   Last edited by: Priscille Heidelberg, RN Lynnell Dike, LPN   5-year risk: 2 % 1.9 %   Lifetime risk: 9.9 % 10.4 %           A: [facility] exam with pap smear   P: Referred patient to the [facility] for a screening mammogram. Appointment scheduled Tuesday, Nov 07, 2019 at 1430.  Priscille Heidelberg, RN 11/07/2019 1:51 PM

## 2019-11-07 NOTE — Patient Instructions (Signed)
Explained breast self awareness with Gwynne Edinger de Rangel. Patient did not need a Pap smear today due to last Pap smear was in 08/08/2018. Let her know [facility] will cover Pap smears every 3 years unless has a history of abnormal Pap smears. Referred patient to the [facility] for a screening mammogram. Appointment scheduled Tuesday, Nov 07, 2019 at 1430. Patient aware of appointment and will be there. Let patient know the Breast Center will follow up with her within the next couple weeks with results of her mammogram by letter or phone. Gwynne Edinger de Rangel verbalized understanding.  [place], Kathaleen Maser, RN 1:50 PM

## 2019-11-28 ENCOUNTER — Other Ambulatory Visit: Payer: Self-pay

## 2019-11-28 DIAGNOSIS — Z0000 Encounter for general adult medical examination without abnormal findings: Secondary | ICD-10-CM

## 2019-11-29 ENCOUNTER — Other Ambulatory Visit: Payer: Self-pay

## 2019-11-29 ENCOUNTER — Inpatient Hospital Stay: Payer: Self-pay | Attending: Obstetrics and Gynecology | Admitting: *Deleted

## 2019-11-29 VITALS — BP 150/92 | Ht 60.0 in | Wt 143.1 lb

## 2019-11-29 DIAGNOSIS — Z0000 Encounter for general adult medical examination without abnormal findings: Secondary | ICD-10-CM

## 2019-11-29 NOTE — Progress Notes (Signed)
[facility] initial screening   Interpreter- Natale Lay   Clinical Measurement:  Vitals:   11/29/19 0825 11/29/19 1024  BP: (!) 138/98 (!) 150/92   Fasting Labs Drawn Today, will review with patient when they result.   Medical History:  Patient states that she has high cholesterol, has high blood pressure and she does not have diabetes.  Medications:  Patient states that she does take medication to lower cholesterol, blood pressure and blood sugar.  Patient does not take an aspirin a day to help prevent a heart attack or stroke. During the past 7 days patient has taken prescribed medication to lower blood pressure on 0 days.   Blood pressure, self measurement: Patient states that she does not measure blood pressure from home. She checks her blood pressure N/A. She shares her readings with a health care provider: N/A.   Nutrition: Patient states that on average she eats 0 cups of fruit and 0 cups of vegetables per day. Patient states that she does not eat fish at least 2 times per week. Patient eats less than half servings of whole grains. Patient drinks less than 36 ounces of beverages with added sugar weekly: yes. Patient is currently watching sodium or salt intake: no. In the past 7 days patient has had 0 drinks containing alcohol. On average patient drinks 0 drinks containing alcohol per day.      Physical activity:  Patient states that she gets 0 minutes of moderate and 0 minutes of vigorous physical activity each week.  Smoking status:  Patient states that she has has never smoked .   Quality of life:  Over the past 2 weeks patient states that she had little interest or pleasure in doing things: several days. She has been feeling down, depressed or hopeless:several days.    Risk reduction and counseling:   Health Coaching: Patient stated that she was prescribed blood pressure medication by a doctor in [place]. Patient did not have the medication with her during visit and does not  know the name of the medication or the dose that she was prescribed. Patient stated that she has not taken the medication in the past month or so because she thought that it was bad to be on medication long term. Patient stated that the her son has told her that she needs to take the medication. Informed patient that I will be referring her to Internal Medicine for follow-up for elevated blood pressure. I also informed patient that if the doctor that she sees for follow-up prescribes her medication for her blood pressure that it is important to take the medication every day and at the same time every day. Patient verbalized understanding.  Encouraged patient to try and adds fruits and vegetables to daily diet. Patient currently does not eat fruits or vegetables every day. Explained to her that the recommendation is for 2 cups of fruit and 3 cups of vegetables every day. We also discussed the importance of whole grains and heart healthy fish  Gave patient examples of different foods she can try from these food groups. Patient also stated that she does not watch the amount of salt/sodium that she consumes. Encouraged her to try and cut back on the amount of salt she consumes because that can also elevate blood pressure. Lastly we talked about trying to get in a little daily exercise through walking.    Navigation:  I will notify patient of lab results.  Patient is aware of 2 more health coaching sessions  and a follow up. Will give patient follow-up appointment information for Internal Medicine once appointment has been scheduled.   Time: 30 minutes

## 2019-11-30 LAB — LIPID PANEL
Chol/HDL Ratio: 4 ratio (ref 0.0–4.4)
Cholesterol, Total: 255 mg/dL — ABNORMAL HIGH (ref 100–199)
HDL: 64 mg/dL (ref >39)
LDL Chol Calc (NIH): 157 mg/dL — ABNORMAL HIGH (ref 0–99)
Triglycerides: 189 mg/dL — ABNORMAL HIGH (ref 0–149)
VLDL Cholesterol Cal: 34 mg/dL (ref 5–40)

## 2019-11-30 LAB — HEMOGLOBIN A1C
Est. average glucose Bld gHb Est-mCnc: 203 mg/dL
Hgb A1c MFr Bld: 8.7 % — ABNORMAL HIGH (ref 4.8–5.6)

## 2019-11-30 LAB — GLUCOSE, RANDOM: Glucose: 187 mg/dL — ABNORMAL HIGH (ref 65–99)

## 2019-11-30 NOTE — Progress Notes (Signed)
Lab results

## 2019-12-07 ENCOUNTER — Telehealth: Payer: Self-pay

## 2019-12-07 NOTE — Telephone Encounter (Signed)
error 

## 2019-12-07 NOTE — Telephone Encounter (Signed)
Left patient a message via interpreter Natale Lay to discuss lab results from the [facility] program. Left name and number for patient to call back.

## 2019-12-11 ENCOUNTER — Telehealth: Payer: Self-pay

## 2019-12-11 ENCOUNTER — Telehealth (INDEPENDENT_AMBULATORY_CARE_PROVIDER_SITE_OTHER): Payer: Self-pay

## 2019-12-11 DIAGNOSIS — Z712 Person consulting for explanation of examination or test findings: Secondary | ICD-10-CM

## 2019-12-11 NOTE — Telephone Encounter (Signed)
Health coaching 2   interpreter- Natale Lay, [employer]   Labs- 255 cholesterol, 157 LDL cholesterol, 189 triglycerides, 64 HDL cholesterol, 8.7 hemoglobin A1C , 187 mean plasma glucose. Patient understands and is aware of her lab results.   Goals-  Discussed lab results with patient and answered any questions that patient had regarding results.   Reduce the amount of fried and fatty foods consumed. Try to grill, bake, broil or sautee foods instead. Reduce the amount of sweet and sugars consumed in both food and drinks. Reduce the amount of carbohydrates consumed. Try and start exercising for 20 minutes a day. We also discussed adding more fruits and vegetables in daily diet as well as whole grains and heart healthy fish.   Navigation:  Patient is aware of 1 more health coaching sessions and a follow up. Patient is scheduled with [facility] on Tuesday, June 29th @ 10:15 am for follow-up for elevated BP and labs.  Time- 20 minutes

## 2019-12-11 NOTE — Telephone Encounter (Signed)
Left message with patient's daughter about follow-up appointment information for her mother. Patient is scheduled with [facility] on Tuesday, June 29th @ 10:15 am. Left name and number if patient has anymore questions regarding appointment.

## 2019-12-11 NOTE — Telephone Encounter (Signed)
Pt's daughter left VM on nurse line requesting a call back regarding appt time.   Called pt with interpreter Alis. Daughter answered the phone and states her mother is at work and is not able to speak to [place] at this time. Requests information about upcoming appt. I explained that I do not know where her next appt is but that I will reach out to [facility] and ask them to contact the patient regarding follow up.

## 2019-12-12 ENCOUNTER — Ambulatory Visit (INDEPENDENT_AMBULATORY_CARE_PROVIDER_SITE_OTHER): Payer: Self-pay | Admitting: Internal Medicine

## 2019-12-12 VITALS — BP 137/67 | HR 63 | Temp 98.1°F | Ht 60.0 in | Wt 143.7 lb

## 2019-12-12 DIAGNOSIS — E785 Hyperlipidemia, unspecified: Secondary | ICD-10-CM

## 2019-12-12 DIAGNOSIS — E119 Type 2 diabetes mellitus without complications: Secondary | ICD-10-CM

## 2019-12-12 DIAGNOSIS — I10 Essential (primary) hypertension: Secondary | ICD-10-CM

## 2019-12-12 MED ORDER — METFORMIN HCL ER 500 MG PO TB24: 500.0000 mg | ORAL_TABLET | Freq: Every day | ORAL | 0 refills | Status: DC

## 2019-12-12 MED ORDER — ROSUVASTATIN CALCIUM 10 MG PO TABS: 10.0000 mg | ORAL_TABLET | Freq: Every day | ORAL | 0 refills | Status: DC

## 2019-12-12 MED ORDER — LOSARTAN POTASSIUM 50 MG PO TABS: 50.0000 mg | ORAL_TABLET | Freq: Every day | ORAL | 0 refills | Status: DC

## 2019-12-12 MED FILL — METFORMIN HCL ER 500 MG TB2: 500 | 30 days supply | Qty: 30 | Fill #0

## 2019-12-12 MED FILL — LOSARTAN POTASSIUM 50 MG TA: 50 | 30 days supply | Qty: 30 | Fill #0

## 2019-12-12 MED FILL — ROSUVASTATIN CALCIUM 10 MG: 10 | 30 days supply | Qty: 30 | Fill #0

## 2019-12-12 NOTE — Progress Notes (Signed)
New Patient Office Visit  Subjective:  Patient ID: Deborah Lyons, female    DOB: [birth year]  Age: 59 y.o. MRN: [id number]  CC: No chief complaint on file. Referral for abnormal lab work  HPI Gwynne Edinger de [place] female with PMHx as documented below, referred from [facility] for abnormal blood work (hemoglobin A1c and lipid profile).  Language interpreter assisted with this encounter. Please refer to problem based charting for further details and assessment and plan of current problem and chronic medical conditions.  Past Medical History:  Diagnosis Date  . Hypertension    Medications: Used to take Losartan 50 mg QD.  Past Surgical History:  Procedure Laterality Date  . arm surgery Left 06/2019   surgery to remove fat tissue (lump) done in [place]  . CESAREAN SECTION      Family History  Problem Relation Age of Onset  . Hypertension Mother   . Breast cancer Sister   . Stroke Brother    Social Hx: No smoking, no alcohol  Social History   Socioeconomic History  . Marital status: Married    Spouse name: Not on file  . Number of children: 4  . Years of education: Not on file  . Highest education level: 3rd grade  Occupational History  . Not on file  Tobacco Use  . Smoking status: Never Smoker  . Smokeless tobacco: Never Used  Vaping Use  . Vaping Use: Never used  Substance and Sexual Activity  . Alcohol use: No  . Drug use: No  . Sexual activity: Not Currently    Birth control/protection: Post-menopausal  Other Topics Concern  . Not on file  Social History Narrative  . Not on file   Social Determinants of Health   Financial Resource Strain:   . Difficulty of Paying Living Expenses:   Food Insecurity:   . Worried About Programme researcher, broadcasting/film/video in the Last Year:   . Barista in the Last Year:   Transportation Needs: No Transportation Needs  . Lack of Transportation (Medical): No  . Lack of Transportation (Non-Medical): No  Physical Activity:   .  Days of Exercise per Week:   . Minutes of Exercise per Session:   Stress:   . Feeling of Stress :   Social Connections:   . Frequency of Communication with Friends and Family:   . Frequency of Social Gatherings with Friends and Family:   . Attends Religious Services:   . Active Member of Clubs or Organizations:   . Attends Banker Meetings:   Marland Kitchen Marital Status:   Intimate Partner Violence:   . Fear of Current or Ex-Partner:   . Emotionally Abused:   Marland Kitchen Physically Abused:   . Sexually Abused:     ROS Review of Systems  Constitutional: Negative for chills, fatigue and fever.  HENT: Positive for hearing loss.   Respiratory: Negative for shortness of breath.   Gastrointestinal: Negative for diarrhea.   8.7 and patient referred for Objective:   Today's Vitals: BP 137/67 (BP Location: Right Arm, Patient Position: Sitting, Cuff Size: Small)   Pulse 63   Temp 98.1 F (36.7 C) (Oral)   Ht 5' (1.524 m)   Wt 143 lb 11.2 oz (65.2 kg)   SpO2 99%   BMI 28.06 kg/m   Physical Exam  Assessment & Plan:   Problem List Items Addressed This Visit      Cardiovascular and Mediastinum   Essential hypertension  This is the first visit at [facility].  Patient with history of hypertension, was supposed to be on lisinopril 50 mg daily (she mentions that it prescribed for her at [place] but she did not take it for several months until yesterday when she took 1 lisinopril tablet. Blood pressure today is 137/67. Encouraged to resume losartan. -Sent prescription for losartan 50 mg daily -Patient is applying for orange card. -Follow-up in [facility] in 1 month for blood pressure check and BMP      Relevant Medications   losartan (COZAAR) 50 MG tablet   rosuvastatin (CRESTOR) 10 MG tablet   Other Relevant Orders   BMP8+Anion Gap (Completed)     Endocrine   Type 2 diabetes mellitus without complication, without long-term current use of insulin (HCC) - Primary    This is a new diagnosis.  Screening lab advised woman showed hemoglobin A1c of 8.7 and patient [facility]. Patient denies any polydipsia, polyuria.     -Starting Metformin 500 mg QD -Provided information for low carb diet -BMP next visit  -f/u with financial counselor for orange card -DM and nutrition counseling session with [name] next visit       Relevant Medications   metFORMIN (GLUCOPHAGE-XR) 500 MG 24 hr tablet   losartan (COZAAR) 50 MG tablet   rosuvastatin (CRESTOR) 10 MG tablet   Other Relevant Orders   BMP8+Anion Gap (Completed)     Other   Hyperlipidemia    This is a new diagnosis and pt referred to [facility] for further work-up after lipid panel at [facility] showed total cholesterol of 255, LDL 157.   ASCVD risk score is 8.6, suggestive of moderate cardiovascular risk.  -Started patient on moderate intensity statin (Crestor 10 mg daily)         Relevant Medications   losartan (COZAAR) 50 MG tablet   rosuvastatin (CRESTOR) 10 MG tablet      Outpatient Encounter Medications as of 12/12/2019  Medication Sig  . acetaminophen (TYLENOL) 500 MG tablet Take 1,000 mg by mouth every 6 (six) hours as needed. (Patient not taking: Reported on 11/29/2019)  . losartan (COZAAR) 50 MG tablet Take 1 tablet (50 mg total) by mouth daily.  . metFORMIN (GLUCOPHAGE-XR) 500 MG 24 hr tablet Take 1 tablet (500 mg total) by mouth daily with breakfast.  . naproxen (NAPROSYN) 250 MG tablet Take by mouth 2 (two) times daily with a meal. (Patient not taking: Reported on 11/29/2019)  . rosuvastatin (CRESTOR) 10 MG tablet Take 1 tablet (10 mg total) by mouth daily.   No facility-administered encounter medications on file as of 12/12/2019.    Follow-up: No follow-ups on file.   Chevis Pretty, MD

## 2019-12-12 NOTE — Patient Instructions (Signed)
Gracias por permitirnos brindarle atencin hoy. Su anlisis de sangre reciente muestra que tiene diabetes y Engineer, production. Comienzo con medicamentos para la diabetes (metformina 500 mg al da) y medicamentos para el colesterol (rosuvastatina 10 mg al da). Envi un recambio para su medicamento para la presin arterial (losartn 50 mg al da). Recoja sus medicamentos en la farmacia para pacientes ambulatorios de [place] y tmelos segn las instrucciones. Solicite el reabastecimiento 1 semana antes de que se le [employer]  Intenta minimizar la ingesta de carbohidratos (arroz, azcar, bebidas dulces, miel, dtiles, ...).  Tome sus medicamentos y haga un seguimiento en la clnica en 3 a 4 semanas. Haga una cita con nuestro asesor financiero para solicitar la tarjeta naranja.  He ordenado algunos laboratorios para ti. Llamar si alguno es anormal.  Si tiene alguna pregunta o inquietud, llame a la clnica de medicina interna al [phone].  Gracias!      Thank you for allowing [place] to provide your care today.  Your recent blood work, shows that you have diabetes and high cholesterol. I start you on diabetes medicine (Metformin 500 mg daily) and cholesterol medication (rosuvastatin 10 mg daily).  I sent refill for your blood pressure medicine (losartan 50 mg daily). Please pick up your medications from [facility] outpatient pharmacy and take them as instructed. Ask for refill 1 week before you run out of your medications. Try to minimize carbohydrate intakes (rice, sugar, sweet drinks, honey, date,...).   Please take your medications and follow-up in clinic in 3 to 4 weeks. Please make an appointment with our financial counselor to apply for orange card.  I have ordered some labs for you. I will call if any are abnormal.   Try to minimize carbohydrate intakes (rice, sugar, sweet drinks, honey, date,...).   Please take your medications and follow-up in clinic in 3 to 4  weeks. Please make an appointment with our financial counselor to apply for orange card.  I have ordered some labs for you. I will call if any are abnormal.   Should you have any questions or concerns please call the internal medicine clinic at [phone].    Thank you!  Diabetes mellitus y nutricin, en adultos Diabetes Mellitus and Nutrition, Adult Si sufre de diabetes (diabetes mellitus), es muy importante tener hbitos alimenticios saludables debido a que sus niveles de Psychologist, counselling sangre (glucosa) se ven afectados en gran medida por lo que come y bebe. [place] alimentos saludables en las cantidades [place], aproximadamente a la [employer], [place] ayudar a:  Scientist, physiological glucemia.  Disminuir el riesgo de sufrir una enfermedad cardaca.  Mejorar la presin arterial.  Barista o mantener un peso saludable. Todas las personas que sufren de diabetes son diferentes y cada una tiene necesidades diferentes en cuanto a un plan de alimentacin. El mdico puede recomendarle que trabaje con un especialista en dietas y nutricin (nutricionista) para Tax adviser plan para usted. Su plan de alimentacin puede variar segn factores como:  Las caloras que necesita.  Los medicamentos que toma.  Su peso.  Sus niveles de glucemia, presin arterial y colesterol.  Su nivel de [place].  Otras afecciones que tenga, como enfermedades cardacas o renales. Cmo me afectan los carbohidratos? Los carbohidratos, o hidratos de carbono, afectan su nivel de glucemia ms que cualquier otro tipo de alimento. La ingesta de carbohidratos naturalmente aumenta la cantidad de [employer]. El recuento de carbohidratos es un mtodo destinado a Midwife  un registro de la cantidad de carbohidratos que se consumen. El recuento de carbohidratos es importante para Pharmacologist la glucemia a un nivel saludable, especialmente si utiliza insulina o toma determinados medicamentos por va oral para la  diabetes. Es importante conocer la cantidad de carbohidratos que se pueden ingerir en cada comida sin correr Surveyor, minerals. Esto es Government social research officer. Su nutricionista puede ayudarlo a calcular la cantidad de carbohidratos que debe ingerir en cada comida y en cada refrigerio. Entre los alimentos que contienen carbohidratos, se incluyen:  Pan, cereal, arroz, pastas y galletas.  Papas y maz.  Guisantes, frijoles y lentejas.  Leche y Dentist.  Nils Pyle y [place].  Postres, como pasteles, galletas, helado y caramelos. Cmo me afecta el alcohol? El alcohol puede provocar disminuciones sbitas de la glucemia (hipoglucemia), especialmente si utiliza insulina o toma determinados medicamentos por va oral para la diabetes. La hipoglucemia es una afeccin potencialmente mortal. Los sntomas de la hipoglucemia (somnolencia, mareos y confusin) son similares a los sntomas de haber consumido demasiado alcohol. Si el mdico afirma que el alcohol es seguro para usted, [place] estas pautas:  Limite el consumo de alcohol a no ms de por da si es mujer y no est [place], y a si es hombre. Una medida equivale a 12oz ( ) de cerveza, 5oz ( ) de vino o 1oz (49ml) de bebidas alcohlicas de alta graduacin.  No beba con el estmago vaco.  Mantngase hidratado bebiendo agua, refrescos dietticos o t helado sin azcar.  Tenga en cuenta que los refrescos comunes, los jugos y otras bebida para Engineer, manufacturing pueden contener mucha azcar y se deben contar como carbohidratos. Cules son algunos consejos para seguir este plan?  Leer las etiquetas de los alimentos  Comience por leer el tamao de la porcin en la "Informacin nutricional" en las etiquetas de los alimentos envasados y las bebidas. La cantidad de caloras, carbohidratos, grasas y otros nutrientes mencionados en la etiqueta se basan en una porcin del alimento. Muchos alimentos contienen ms de una porcin por  envase.  Verifique la cantidad total de gramos (g) de carbohidratos totales en una porcin. Puede calcular la cantidad de porciones de carbohidratos al dividir el total de carbohidratos por 15. Por ejemplo, si un alimento tiene un total de 30g de carbohidratos, equivale a 2 porciones de carbohidratos.  Verifique la cantidad de gramos (g) de grasas saturadas y grasas trans en una porcin. Escoja alimentos que no contengan grasa o que tengan un bajo contenido.  Verifique la cantidad de miligramos (mg) de sal (sodio) en una porcin. La mayora de las personas deben limitar la ingesta de sodio total a menos de 2300mg  por .  Siempre consulte la informacin nutricional de los alimentos etiquetados como "con bajo contenido de grasa" o "sin grasa". Estos alimentos pueden tener un mayor contenido de Futures trader agregada o carbohidratos refinados, y deben evitarse.  Hable con su nutricionista para identificar sus objetivos diarios en cuanto a los nutrientes mencionados en la etiqueta. Al ir de compras  Evite comprar alimentos procesados, enlatados o precocinados. Estos alimentos tienden a International aid/development worker mayor cantidad de [place], sodio y azcar agregada.  Compre en la zona exterior de la tienda de comestibles. Esta zona incluye frutas y verduras frescas, granos a granel, carnes frescas y productos lcteos frescos. Al cocinar  Utilice mtodos de coccin a baja temperatura, como hornear, en lugar de mtodos de coccin a alta temperatura, como frer en abundante aceite.  Cocine con aceites saludables, como el aceite de  oliva, canola o girasol.  Evite cocinar con manteca, crema o carnes con alto contenido de grasa. Planificacin de las comidas  Coma las comidas y los refrigerios regularmente, preferentemente a la misma hora todos [place]. Evite pasar largos perodos de tiempo sin comer.  Consuma alimentos ricos en fibra, como frutas frescas, verduras, frijoles y cereales integrales. Consulte a su  nutricionista sobre cuntas porciones de carbohidratos puede consumir en cada comida.  Consuma entre 4 y 6 onzas (oz) de protenas magras por da, como carnes [place], pollo, pescado, huevos o tofu. Una onza de protena magra equivale a: ? 1 onza de carne, pollo o pescado. ? 1huevo. ?  taza de tofu.  Coma algunos alimentos por da que contengan grasas saludables, como aguacates, frutos secos, semillas y pescado. Estilo de vida  Controle su nivel de glucemia con regularidad.  Haga actividad fsica habitualmente como se lo haya indicado el mdico. Esto puede incluir lo siguiente: ? semanales de ejercicio de intensidad moderada o alta. Esto podra incluir caminatas dinmicas, ciclismo o gimnasia acutica. ? Realizar ejercicios de elongacin y de fortalecimiento, como yoga o levantamiento de pesas, por lo menos 2veces por semana.  Tome los [employer] se lo haya indicado el mdico.  No consuma ningn producto que contenga nicotina o tabaco, como cigarrillos y Administrator, Civil Service. Si necesita ayuda para dejar de fumar, consulte al C[employer] con un asesor o instructor en diabetes para identificar estrategias para controlar el estrs y cualquier desafo emocional y social. Preguntas para hacerle al mdico  Es necesario que consulte a IT trainer en el cuidado de la diabetes?  Es necesario que me rena con un nutricionista?  A qu nmero puedo llamar si tengo preguntas?  Cules son los mejores momentos para controlar la glucemia? Dnde encontrar ms informacin:  Asociacin Estadounidense de la Diabetes (American Diabetes Association): diabetes.org  Academia de Nutricin y Pension scheme manager (Academy of Nutrition and Dietetics): www.eatright.org  [employer] de la Diabetes y las Enfermedades Digestivas y [facility] of Diabetes and Digestive and Kidney Diseases, NIH): CarFlippers.tn Resumen  Un plan de alimentacin saludable lo  ayudar a Scientist, physiological glucemia y Pharmacologist un estilo de vida saludable.  Trabajar con un especialista en dietas y nutricin (nutricionista) puede ayudarlo a Designer, television/film set de alimentacin para usted.  Tenga en cuenta que los carbohidratos (hidratos de carbono) y el alcohol tienen efectos inmediatos en sus niveles de glucemia. Es importante contar los carbohidratos que ingiere y consumir alcohol con prudencia. Esta informacin no tiene Theme park manager el consejo del mdico. Asegrese de hacerle al mdico cualquier pregunta que tenga. Document Revised: 02/09/2017 Document Reviewed: 09/21/2016 Elsevier Patient Education  2020 [employer].

## 2019-12-13 DIAGNOSIS — E119 Type 2 diabetes mellitus without complications: Secondary | ICD-10-CM | POA: Insufficient documentation

## 2019-12-13 DIAGNOSIS — E785 Hyperlipidemia, unspecified: Secondary | ICD-10-CM | POA: Insufficient documentation

## 2019-12-13 DIAGNOSIS — I10 Essential (primary) hypertension: Secondary | ICD-10-CM | POA: Insufficient documentation

## 2019-12-13 LAB — BMP8+ANION GAP
Anion Gap: 14 mmol/L (ref 10.0–18.0)
BUN/Creatinine Ratio: 21 (ref 12–28)
BUN: 12 mg/dL (ref 8–27)
CO2: 23 mmol/L (ref 20–29)
Calcium: 9.5 mg/dL (ref 8.7–10.3)
Chloride: 101 mmol/L (ref 96–106)
Creatinine, Ser: 0.56 mg/dL — ABNORMAL LOW (ref 0.57–1.00)
GFR calc Af Amer: 116 mL/min/{1.73_m2} (ref >59)
GFR calc non Af Amer: 101 mL/min/{1.73_m2} (ref >59)
Glucose: 149 mg/dL — ABNORMAL HIGH (ref 65–99)
Potassium: 4.3 mmol/L (ref 3.5–5.2)
Sodium: 138 mmol/L (ref 134–144)

## 2019-12-13 NOTE — Assessment & Plan Note (Signed)
This is a new diagnosis and pt referred to [facility] for further work-up after lipid panel at [facility] showed total cholesterol of 255, LDL 157.   ASCVD risk score is 8.6, suggestive of moderate cardiovascular risk.  -Started patient on moderate intensity statin (Crestor 10 mg daily)

## 2019-12-13 NOTE — Assessment & Plan Note (Addendum)
This is a new diagnosis. Screening lab advised woman showed hemoglobin A1c of 8.7 and patient referred [facility] for[facility]. Patient denies any polydipsia, polyuria.     -Starting Metformin 500 mg QD -Provided information for low carb diet -BMP today (addendum: BMP showed normal kidney function and okay to start Metformin) -f/u with financial counselor for orange card -DM and nutrition counseling session with [name] next visit  -Follow-up in clinic in 1 month

## 2019-12-13 NOTE — Assessment & Plan Note (Signed)
This is the first visit at [facility].  Patient with history of hypertension, was supposed to be on lisinopril 50 mg daily (she mentions that it prescribed for her at [place] but she did not take it for several months until yesterday when she took 1 lisinopril tablet. Blood pressure today is 137/67. Encouraged to resume losartan. -Sent prescription for losartan 50 mg daily -Patient is applying for orange card. -Follow-up in [facility] in 1 month for blood pressure check and BMP

## 2019-12-15 NOTE — Progress Notes (Signed)
Internal Medicine Clinic Attending  Case discussed with Dr. Masoudi  at the time of the visit.  We reviewed the resident's history and exam and pertinent patient test results.  I agree with the assessment, diagnosis, and plan of care documented in the resident's note.  

## 2019-12-22 ENCOUNTER — Encounter: Payer: Self-pay | Admitting: Internal Medicine

## 2020-01-17 ENCOUNTER — Ambulatory Visit: Payer: No Typology Code available for payment source

## 2020-02-05 ENCOUNTER — Telehealth: Payer: Self-pay | Admitting: *Deleted

## 2020-02-05 NOTE — Telephone Encounter (Signed)
Message left on nurse voicemail in Spanish which was reviewed by Maretta Los, interpreter. The message was left by pt's daughter who stated that her mother missed a call and she is calling back. Per chart review, there were no existing notes stating that this office called the pt and she does not have any upcoming appts within [facility]. I called back with Elane Fritz and a message was left that we did not call.  If pt still has questions, she may call back.

## 2020-07-10 ENCOUNTER — Telehealth: Payer: Self-pay

## 2020-07-10 NOTE — Telephone Encounter (Signed)
Left message with patient via interpreter Erika McReynolds about conducting [facility] follow-up left name and number for her to call back. °

## 2020-08-14 ENCOUNTER — Telehealth: Payer: Self-pay

## 2020-08-14 NOTE — Telephone Encounter (Signed)
Left message for patient via [name] about [facility] for [employer]. Left name and number for patient to call back.

## 2021-11-14 IMAGING — MG DIGITAL SCREENING BILAT W/ TOMO W/ CAD
6 of 10 series · 6 of 30 positions shown · non-contrast
Comparison: Previous exam(s).

CLINICAL DATA: Screening.

EXAM:
DIGITAL SCREENING BILATERAL MAMMOGRAM WITH TOMO AND CAD

[L MLO synth-2D (1 of 2)]
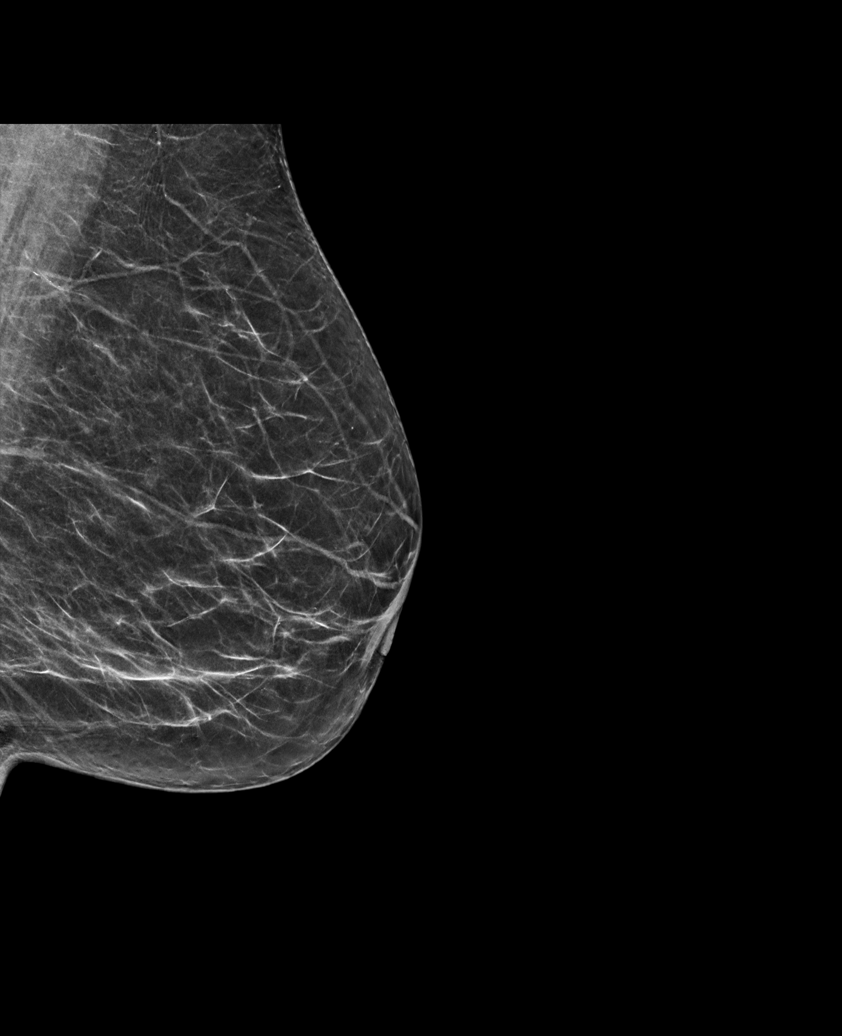

[R CC synth-2D]
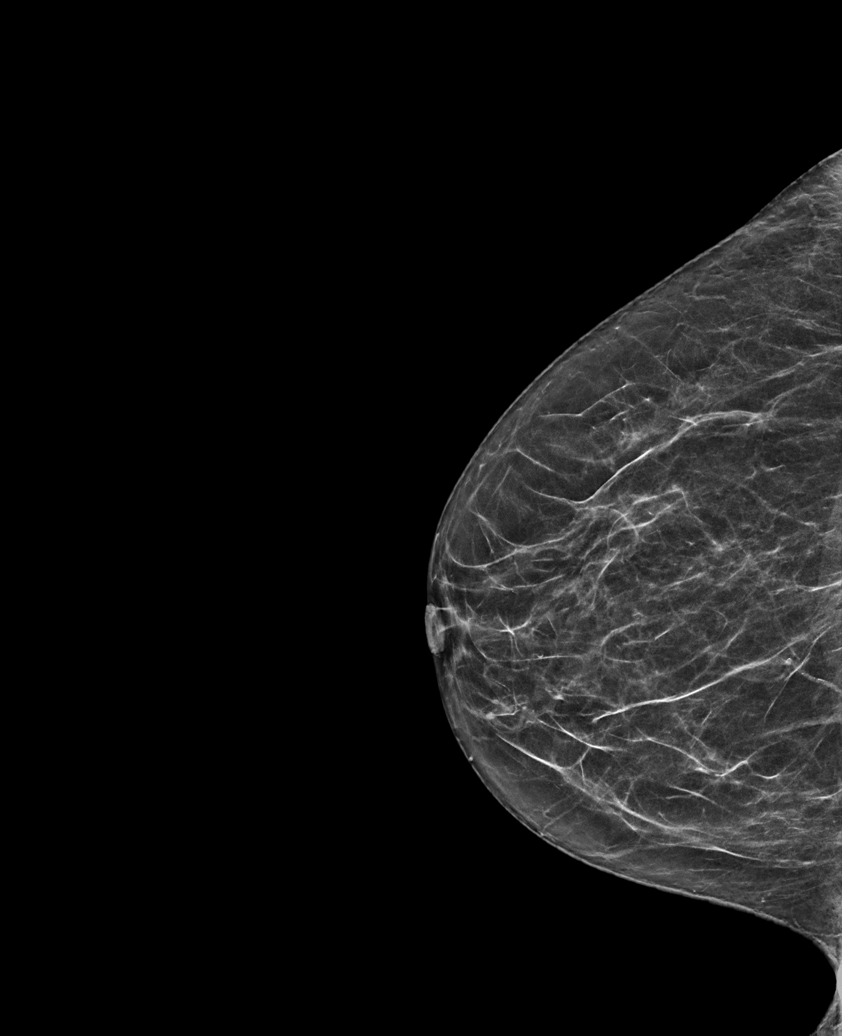

[R MLO synth-2D]
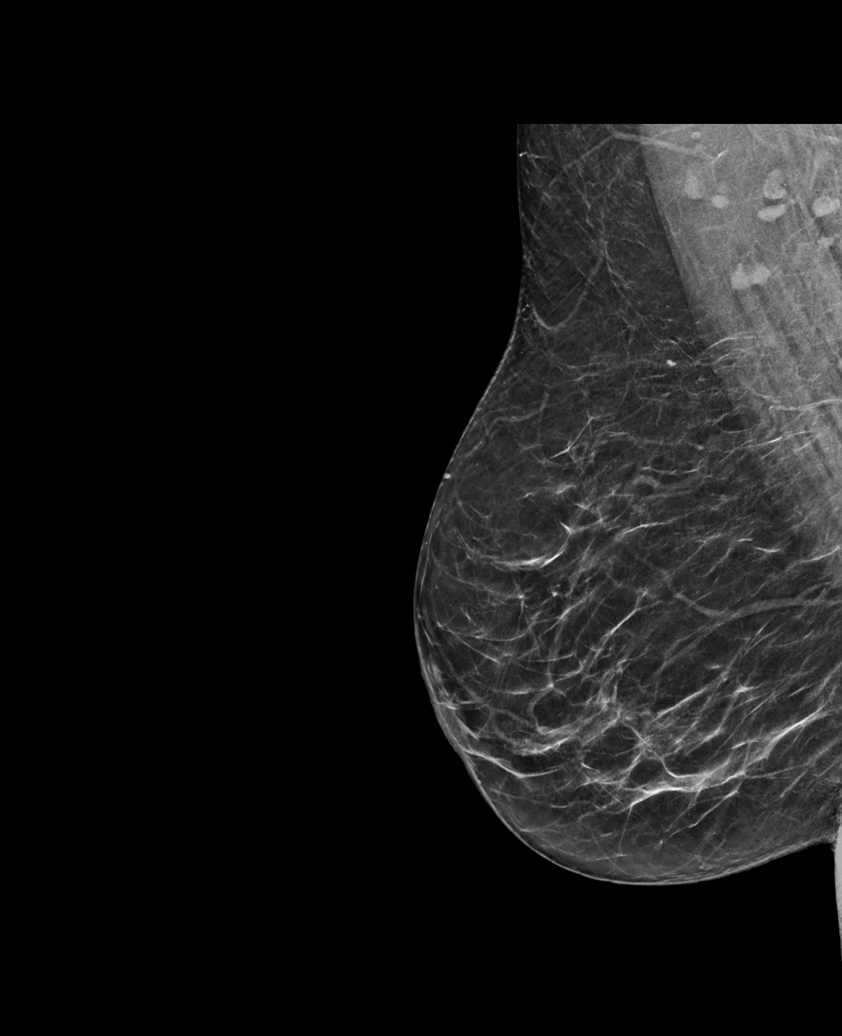

[L CC synth-2D]
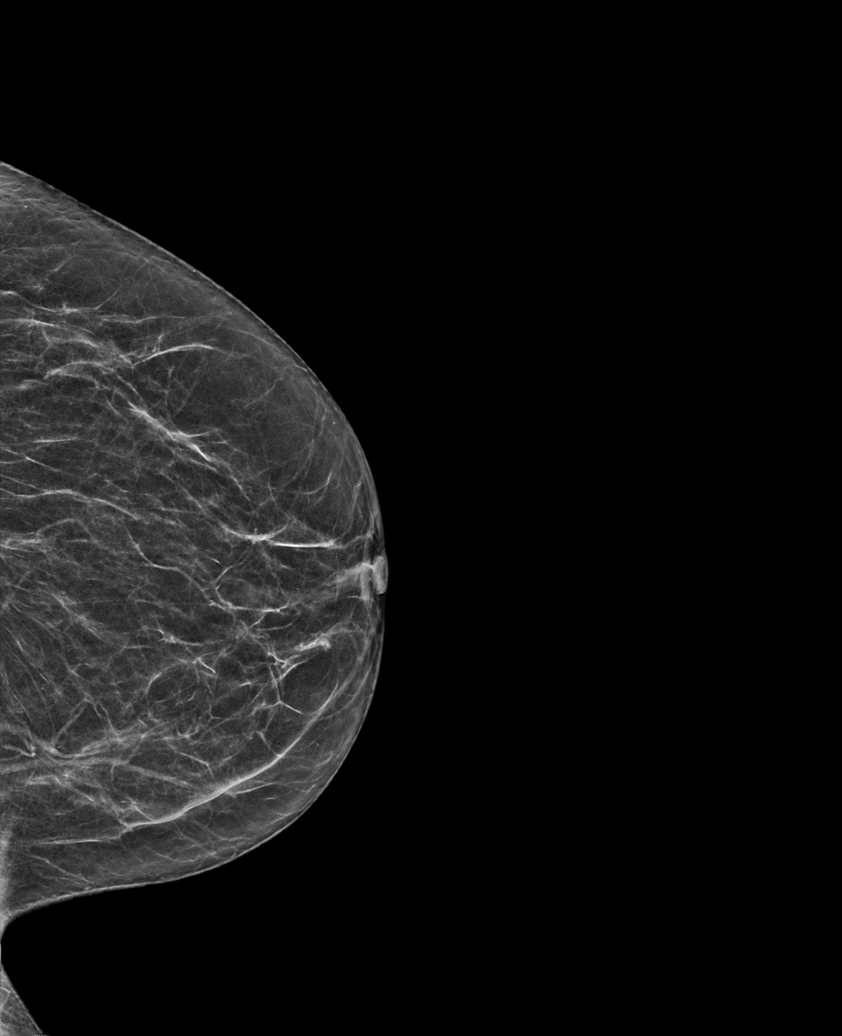

[L MLO synth-2D (2 of 2)]
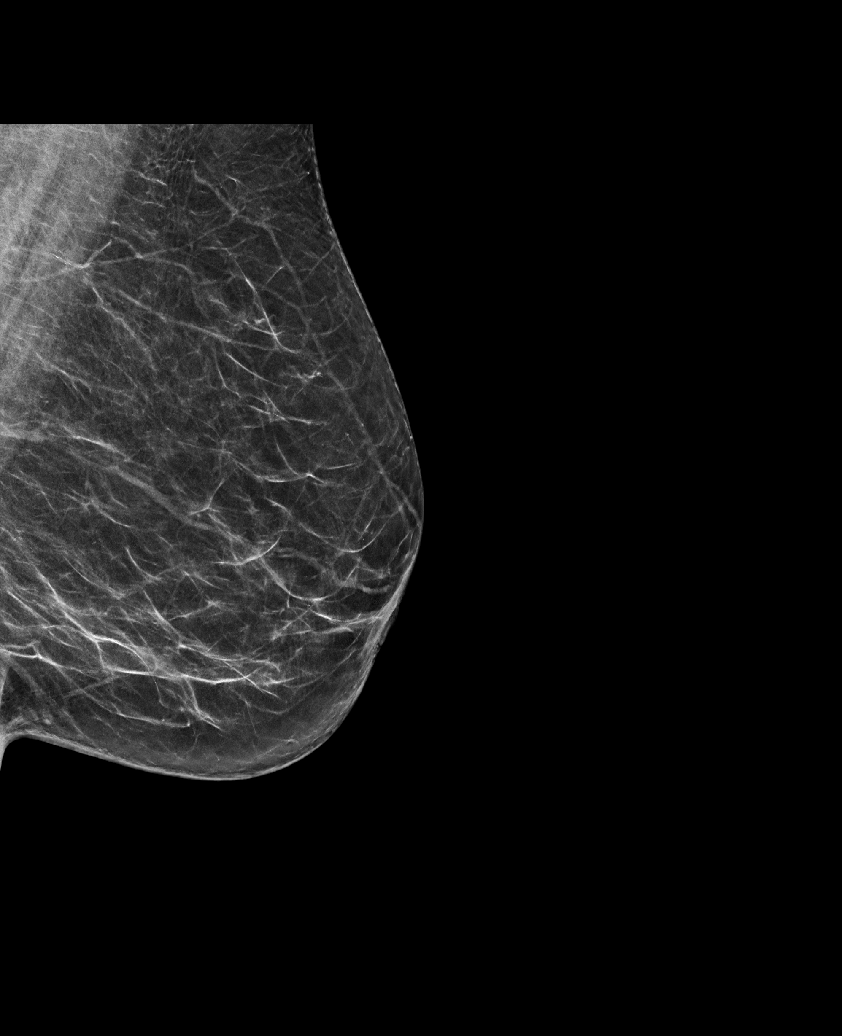

[R CC tomo · tomo slice 29/56.0]
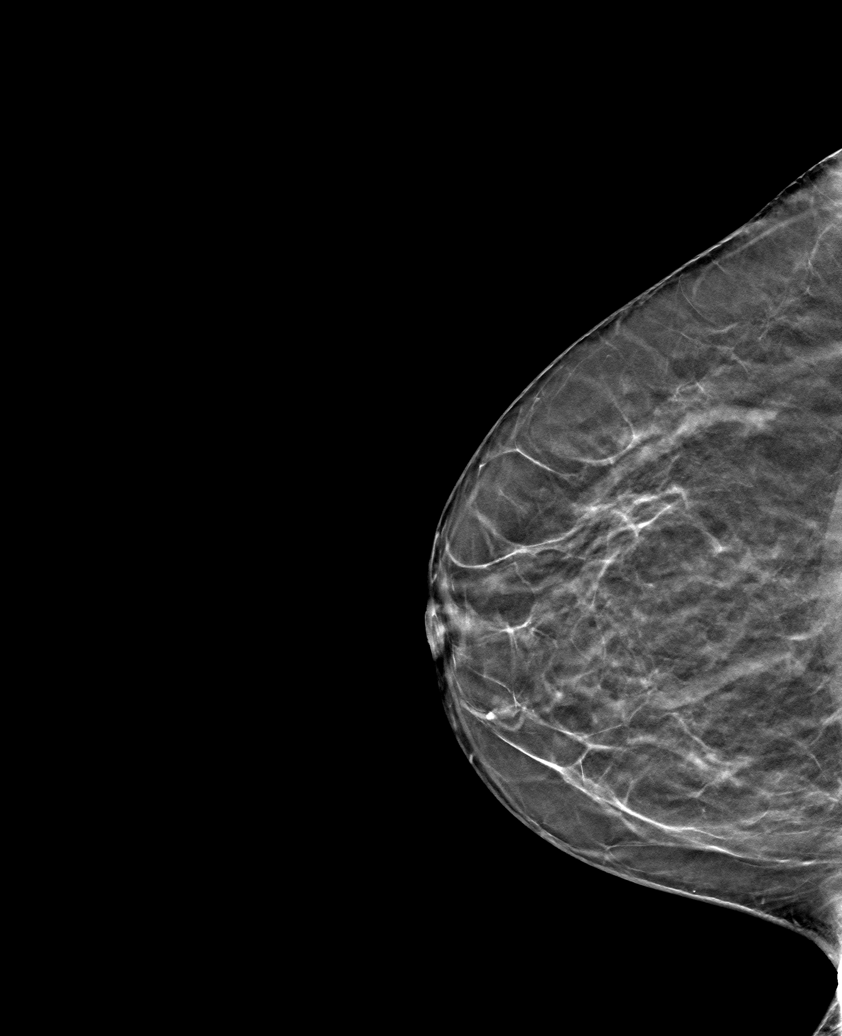

[6 of 30 positions shown; findings below may reference images not displayed]

ACR Breast Density Category b: There are scattered areas of
fibroglandular density.
FINDINGS: There are no findings suspicious for malignancy. Images were
processed with CAD.
IMPRESSION: No mammographic evidence of malignancy. A result letter of this
screening mammogram will be mailed directly to the patient.

RECOMMENDATION:
Screening mammogram in one year. (Code:[id number])

BI-RADS CATEGORY  1: Negative.

## 2022-02-09 ENCOUNTER — Other Ambulatory Visit: Payer: Self-pay

## 2022-02-09 ENCOUNTER — Ambulatory Visit (HOSPITAL_COMMUNITY)
Admission: EM | Admit: 2022-02-09 | Discharge: 2022-02-09 | Disposition: A | Discharge status: Home / Self Care | Payer: No Typology Code available for payment source | Attending: Internal Medicine | Admitting: Internal Medicine

## 2022-02-09 ENCOUNTER — Encounter (HOSPITAL_COMMUNITY): Payer: Self-pay | Admitting: *Deleted

## 2022-02-09 DIAGNOSIS — E1159 Type 2 diabetes mellitus with other circulatory complications: Secondary | ICD-10-CM

## 2022-02-09 DIAGNOSIS — R739 Hyperglycemia, unspecified: Secondary | ICD-10-CM

## 2022-02-09 DIAGNOSIS — I10 Essential (primary) hypertension: Secondary | ICD-10-CM

## 2022-02-09 DIAGNOSIS — E1165 Type 2 diabetes mellitus with hyperglycemia: Secondary | ICD-10-CM

## 2022-02-09 HISTORY — DX: Type 2 diabetes mellitus without complications: E11.9

## 2022-02-09 LAB — CBG MONITORING, ED: Glucose-Capillary: 177 mg/dL — ABNORMAL HIGH (ref 70–99)

## 2022-02-09 MED ORDER — AMLODIPINE BESYLATE 5 MG PO TABS: 5.0000 mg | ORAL_TABLET | Freq: Every day | ORAL | 1 refills | Status: DC

## 2022-02-09 MED ORDER — BLOOD PRESSURE CUFF MISC: 0 refills | Status: AC

## 2022-02-09 NOTE — ED Provider Notes (Signed)
[facility]    CSN: [id number] Arrival date & time: 02/09/22  0802      History   Chief Complaint Chief Complaint  Patient presents with   Headache   Abdominal Pain    HPI [facility] is a 61 y.o. female.   Patient presents urgent care for checkup after not receiving medical care for many years.  She is here with her daughter who states that she has noticed that her mom has been more sad and withdrawn lately.  Daughter states patient had an episode of dizziness and nausea over the weekend 2 days ago that resolved quickly after eating.  Also reports headache to the back of her head 2 days ago that has since resolved after taking her friend's blood pressure medicine of unknown type.  She was diagnosed with hypertension, hyperlipidemia, and type 2 diabetes in June 2021 and prescribed losartan and metformin at that time.  Patient took these medications for 1 month then never picked up the refills.  She has been without medication and without medical care for the last 2 years.  She is currently asymptomatic and states "my body feels normal".  Denies headache, fever/chills, dizziness, nausea, vomiting, polydipsia, polyuria, vaginal discharge/itching, abdominal discomfort, blurry vision, decreased visual acuity, and rash. She does not own a blood pressure cuff at home and does not regularly check blood pressures or blood sugar levels.    Headache Associated symptoms: abdominal pain   Abdominal Pain   Past Medical History:  Diagnosis Date   Diabetes mellitus without complication (HCC)    Hypertension     Patient Active Problem List   Diagnosis Date Noted   Type 2 diabetes mellitus without complication, without long-term current use of insulin (HCC) 12/13/2019   Essential hypertension 12/13/2019   Hyperlipidemia 12/13/2019    Past Surgical History:  Procedure Laterality Date   arm surgery Left 06/2019   surgery to remove fat tissue (lump) done in [place]    CESAREAN SECTION      OB History     Gravida  4   Para      Term      Preterm      AB      Living  4      SAB      IAB      Ectopic      Multiple      Live Births  4            Home Medications    Prior to Admission medications   Medication Sig Start Date End Date Taking? Authorizing Provider  amLODipine (NORVASC) 5 MG tablet Take 1 tablet (5 mg total) by mouth daily. 02/09/22  Yes Carlisle Beers, FNP  Blood Pressure Monitoring (BLOOD PRESSURE CUFF) MISC Take blood pressure 3-4 times weekly and write down numbers in a notebook to bring to primary care doctor appointment. 02/09/22  Yes Vanice Rappa, Donavan Burnet, FNP    Family History Family History  Problem Relation Age of Onset   Hypertension Mother    Breast cancer Sister    Stroke Brother     Social History Social History   Tobacco Use   Smoking status: Never   Smokeless tobacco: Never  Vaping Use   Vaping Use: Never used  Substance Use Topics   Alcohol use: No   Drug use: No     Allergies   Patient has no known allergies.   Review of Systems Review of Systems  Gastrointestinal:  Positive for abdominal pain.  Neurological:  Positive for headaches.  Per HPI   Physical Exam Triage Vital Signs ED Triage Vitals  Enc Vitals Group     BP 02/09/22 0824 (!) 158/79     Pulse Rate 02/09/22 0824 66     Resp 02/09/22 0824 18     Temp 02/09/22 0824 98.4 F (36.9 C)     Temp src --      SpO2 02/09/22 0824 99 %     Weight --      Height --      Head Circumference --      Peak Flow --      Pain Score 02/09/22 0826 0     Pain Loc --      Pain Edu? --      Excl. in [place]? --    No data found.  Updated Vital Signs BP (!) 158/79   Pulse 66   Temp 98.4 F (36.9 C)   Resp 18   SpO2 99%   Visual Acuity Right Eye Distance:   Left Eye Distance:   Bilateral Distance:    Right Eye Near:   Left Eye Near:    Bilateral Near:     Physical Exam Vitals and nursing note reviewed.   Constitutional:      Appearance: Normal appearance. She is not ill-appearing or toxic-appearing.     Comments: Very pleasant patient sitting on exam in position of comfort table in no acute distress.   HENT:     Head: Normocephalic and atraumatic.     Right Ear: Hearing and external ear normal.     Left Ear: Hearing and external ear normal.     Nose: Nose normal.     Mouth/Throat:     Lips: Pink.     Mouth: Mucous membranes are moist.  Eyes:     General: Lids are normal. Vision grossly intact. Gaze aligned appropriately.     Extraocular Movements: Extraocular movements intact.     Conjunctiva/sclera: Conjunctivae normal.  Cardiovascular:     Rate and Rhythm: Normal rate and regular rhythm.     Heart sounds: Normal heart sounds, S1 normal and S2 normal.  Pulmonary:     Effort: Pulmonary effort is normal. No respiratory distress.     Breath sounds: Normal breath sounds and air entry.  Abdominal:     Palpations: Abdomen is soft.  Musculoskeletal:     Cervical back: Neck supple.  Skin:    General: Skin is warm and dry.     Capillary Refill: Capillary refill takes less than 2 seconds.     Findings: No rash.  Neurological:     General: No focal deficit present.     Mental Status: She is alert and oriented to person, place, and time. Mental status is at baseline.     Cranial Nerves: Cranial nerves 2-12 are intact. No dysarthria or facial asymmetry.     Sensory: Sensation is intact.     Motor: Motor function is intact.     Coordination: Coordination is intact.     Gait: Gait is intact.     Comments: 5/5 power throughout. Non focal neuro exam.   Psychiatric:        Mood and Affect: Mood normal.        Speech: Speech normal.        Behavior: Behavior normal.        Thought Content: Thought content normal.  Judgment: Judgment normal.      UC Treatments / Results  Labs (all labs ordered are listed, but only abnormal results are displayed) Labs Reviewed  CBG MONITORING,  ED - Abnormal; Notable for the following components:      Result Value   Glucose-Capillary 177 (*)    All other components within normal limits    EKG   Radiology No results found.  Procedures Procedures (including critical care time)  Medications Ordered in UC Medications - No data to display  Initial Impression / Assessment and Plan / UC Course  I have reviewed the triage vital signs and the nursing notes.  Pertinent labs & imaging results that were available during my care of the patient were reviewed by me and considered in my medical decision making (see chart for details).   1.  Essential hypertension Patient is asymptomatic with a nonfocal neuro exam.  We will begin amlodipine 5 mg once daily to reduce blood pressure.  Blood pressure cuff sent to pharmacy and patient advised to take blood pressure 3-4 times per week.  Advised to write these numbers down in a notebook and bring them to her new primary care appointment once scheduled.  PCP assistance initiated.  Patient and daughter given self scheduling website through [facility] to schedule an appointment online for follow-up with PCP.  Advise low-salt diet, increase exercise, and avoidance of red meat. HTN education handouts provided in AVS provided in [place].   2. Elevated blood sugar level CBG 177 in clinic.  Patient to follow-up with PCP regarding type 2 diabetes.  Advised to lower carbohydrate intake and increase exercise to lower suspected insulin resistance.  Patient to avoid sugary beverages and increase water intake to stay well-hydrated.  CBG elevated, but stable at this time.  Discussed physical exam and available lab work findings in clinic with patient.  Counseled patient regarding appropriate use of medications and potential side effects for all medications recommended or prescribed today. Discussed red flag signs and symptoms of worsening condition,when to call the PCP office, return to urgent care, and when to  seek higher level of care in the emergency department. Patient verbalizes understanding and agreement with plan. All questions answered. Patient discharged in stable condition.  Final Clinical Impressions(s) / UC Diagnoses   Final diagnoses:  Essential hypertension  Elevated blood sugar level     Discharge Instructions      Start taking amlodipine 5 mg every morning to lower your blood pressure. Lower the salt in your diet. Increase your exercise. Avoid eating red meats. Refer to the handouts in your discharge paperwork for further information regarding ways to reduce blood pressure.  To find a primary care provider go to [email or web address] and follow the prompts to schedule a new patient appointment for primary care.  Someone from [facility] health will be reaching out to you either via MyChart or phone call to help you set up a primary care provider appointment as well.  It is very important to have a primary care provider to manage your overall health and prevent/manage chronic medical conditions.   If you develop a severe headache, dizziness, shortness of breath, chest pain, or any other new or worsening symptoms, please return to urgent care or go to the emergency department if symptoms are severe. Otherwise, follow-up with new primary care provider.     ED Prescriptions     Medication Sig Dispense Auth. Provider   Blood Pressure Monitoring (BLOOD PRESSURE CUFF) MISC Take blood pressure 3-4  times weekly and write down numbers in a notebook to bring to primary care doctor appointment. 1 each Carlisle Beers, FNP   amLODipine (NORVASC) 5 MG tablet Take 1 tablet (5 mg total) by mouth daily. 30 tablet Carlisle Beers, FNP      PDMP not reviewed this encounter.   Reita May [place], [place] 02/09/22 [phone]

## 2022-02-09 NOTE — ED Triage Notes (Addendum)
PT reports with interpreter she had HA with facial numbness. Family reported some one gave PT blood pressure med over the weekend.

## 2022-02-09 NOTE — Discharge Instructions (Addendum)
Start taking amlodipine 5 mg every morning to lower your blood pressure. Lower the salt in your diet. Increase your exercise. Avoid eating red meats. Refer to the handouts in your discharge paperwork for further information regarding ways to reduce blood pressure.  To find a primary care provider go to [email or web address] and follow the prompts to schedule a new patient appointment for primary care.  Someone from [facility] health will be reaching out to you either via MyChart or phone call to help you set up a primary care provider appointment as well.  It is very important to have a primary care provider to manage your overall health and prevent/manage chronic medical conditions.   If you develop a severe headache, dizziness, shortness of breath, chest pain, or any other new or worsening symptoms, please return to urgent care or go to the emergency department if symptoms are severe. Otherwise, follow-up with new primary care provider.

## 2022-02-12 ENCOUNTER — Encounter: Payer: Self-pay | Admitting: Emergency Medicine

## 2022-10-20 ENCOUNTER — Other Ambulatory Visit: Payer: Self-pay

## 2022-10-20 ENCOUNTER — Ambulatory Visit: Payer: Self-pay | Attending: Family Medicine | Admitting: Family Medicine

## 2022-10-20 ENCOUNTER — Encounter: Payer: Self-pay | Admitting: Family Medicine

## 2022-10-20 ENCOUNTER — Telehealth: Payer: Self-pay | Admitting: Pharmacist

## 2022-10-20 VITALS — BP 116/75 | HR 75 | Temp 98.0°F | Ht 60.0 in | Wt 135.2 lb

## 2022-10-20 DIAGNOSIS — E785 Hyperlipidemia, unspecified: Secondary | ICD-10-CM

## 2022-10-20 DIAGNOSIS — I10 Essential (primary) hypertension: Secondary | ICD-10-CM

## 2022-10-20 DIAGNOSIS — Z7984 Long term (current) use of oral hypoglycemic drugs: Secondary | ICD-10-CM

## 2022-10-20 DIAGNOSIS — E119 Type 2 diabetes mellitus without complications: Secondary | ICD-10-CM

## 2022-10-20 DIAGNOSIS — Z1159 Encounter for screening for other viral diseases: Secondary | ICD-10-CM

## 2022-10-20 LAB — POCT GLYCOSYLATED HEMOGLOBIN (HGB A1C): HbA1c, POC (controlled diabetic range): 8 % — AB (ref 0.0–7.0)

## 2022-10-20 MED ORDER — ATORVASTATIN CALCIUM 20 MG PO TABS: 20.0000 mg | ORAL_TABLET | Freq: Every day | ORAL | 1 refills | Status: DC

## 2022-10-20 MED ORDER — AMLODIPINE BESYLATE 5 MG PO TABS: 5.0000 mg | ORAL_TABLET | Freq: Every day | ORAL | 1 refills | Status: DC

## 2022-10-20 MED ORDER — METFORMIN HCL 500 MG PO TABS: 1000.0000 mg | ORAL_TABLET | Freq: Two times a day (BID) | ORAL | 1 refills | Status: DC

## 2022-10-20 NOTE — Progress Notes (Signed)
Rectal pain after using the bathroom

## 2022-10-20 NOTE — Progress Notes (Signed)
Subjective:  Patient ID: Deborah Lyons, female    DOB: 1960/11/16  Age: 62 y.o. MRN: [id number]  CC: New Patient (Initial Visit)   HPI Deborah Lyons is a 62 y.o. year old female with a history of Type 2 DM (A1c 8.0), HTN, Hyperlipidemia. Previously followed by the internal medicine clinic.  Interval History:  Presents today to establish care and endorses adherence with metformin and amlodipine.  He has no blurry vision or numbness in extremities.  She goes walking a lot. She is currently not on a statin even though her last set of labs revealed elevated lipid panel. Denies presence of additional concerns today.  Past Medical History:  Diagnosis Date   Diabetes mellitus without complication (HCC)    Hypertension     Past Surgical History:  Procedure Laterality Date   arm surgery Left 06/2019   surgery to remove fat tissue (lump) done in [place]   CESAREAN SECTION      Family History  Problem Relation Age of Onset   Hypertension Mother    Breast cancer Sister    Stroke Brother     Social History   Socioeconomic History   Marital status: Married    Spouse name: Not on file   Number of children: 4   Years of education: Not on file   Highest education level: 3rd grade  Occupational History   Not on file  Tobacco Use   Smoking status: Never   Smokeless tobacco: Never  Vaping Use   Vaping Use: Never used  Substance and Sexual Activity   Alcohol use: No   Drug use: No   Sexual activity: Not Currently    Birth control/protection: Post-menopausal  Other Topics Concern   Not on file  Social History Narrative   Not on file   Social Determinants of Health   Financial Resource Strain: Not on file  Food Insecurity: Not on file  Transportation Needs: No Transportation Needs (11/29/2019)   PRAPARE - Administrator, Civil Service (Medical): No    Lack of Transportation (Non-Medical): No  Recent Concern: Transportation Needs - Unmet  Transportation Needs (11/07/2019)   PRAPARE - Transportation    Lack of Transportation (Medical): Yes    Lack of Transportation (Non-Medical): Yes  Physical Activity: Inactive (07/13/2017)   Exercise Vital Sign    Days of Exercise per Week: 0 days    Minutes of Exercise per Session: 0 min  Stress: No Stress Concern Present (07/13/2017)   [employer] of Occupational Health - Occupational Stress Questionnaire    Feeling of Stress : Not at all  Social Connections: Somewhat Isolated (07/13/2017)   Social Connection and Isolation Panel [NHANES]    Frequency of Communication with Friends and Family: More than three times a week    Frequency of Social Gatherings with Friends and Family: More than three times a week    Attends Religious Services: 1 to 4 times per year    Active Member of [employer] or Organizations: No    Attends Banker Meetings: Never    Marital Status: Separated    No Known Allergies  Outpatient Medications Prior to Visit  Medication Sig Dispense Refill   Blood Pressure Monitoring (BLOOD PRESSURE CUFF) MISC Take blood pressure 3-4 times weekly and write down numbers in a notebook to bring to primary care doctor appointment. 1 each 0   amLODipine (NORVASC) 5 MG tablet Take 1 tablet (5 mg total) by mouth daily. 30  tablet 1   metFORMIN (GLUCOPHAGE) 500 MG tablet Take 500 mg by mouth 2 (two) times daily.     No facility-administered medications prior to visit.     ROS Review of Systems  Constitutional:  Negative for activity change and appetite change.  HENT:  Negative for sinus pressure and sore throat.   Respiratory:  Negative for chest tightness, shortness of breath and wheezing.   Cardiovascular:  Negative for chest pain and palpitations.  Gastrointestinal:  Negative for abdominal distention, abdominal pain and constipation.  Genitourinary: Negative.   Musculoskeletal: Negative.   Psychiatric/Behavioral:  Negative for behavioral problems and dysphoric  mood.     Objective:  BP 116/75   Pulse 75   Temp 98 F (36.7 C) (Oral)   Ht 5' (1.524 m)   Wt 135 lb 3.2 oz (61.3 kg)   SpO2 97%   BMI 26.40 kg/m      10/20/2022    9:09 AM 02/09/2022    8:24 AM 12/12/2019   10:57 AM  BP/Weight  Systolic BP 116 158 137  Diastolic BP 75 79 67  Wt. (Lbs) 135.2  143.7  BMI 26.4 kg/m2  28.06 kg/m2      Physical Exam Constitutional:      Appearance: She is well-developed.  Cardiovascular:     Rate and Rhythm: Normal rate.     Heart sounds: Normal heart sounds. No murmur heard. Pulmonary:     Effort: Pulmonary effort is normal.     Breath sounds: Normal breath sounds. No wheezing or rales.  Chest:     Chest wall: No tenderness.  Abdominal:     General: Bowel sounds are normal. There is no distension.     Palpations: Abdomen is soft. There is no mass.     Tenderness: There is no abdominal tenderness.  Musculoskeletal:        General: Normal range of motion.     Right lower leg: No edema.     Left lower leg: No edema.  Neurological:     Mental Status: She is alert and oriented to person, place, and time.  Psychiatric:        Mood and Affect: Mood normal.        Latest Ref Rng & Units 12/12/2019   12:07 PM 11/29/2019    9:59 AM 02/14/2015    1:05 PM  CMP  Glucose 65 - 99 mg/dL 161  096  045   BUN 8 - 27 mg/dL 12   14   Creatinine 4.09 - 1.00 mg/dL 8.11   9.14   Sodium 782 - 144 mmol/L 138   138   Potassium 3.5 - 5.2 mmol/L 4.3   4.0   Chloride 96 - 106 mmol/L 101   106   CO2 20 - 29 mmol/L 23   26   Calcium 8.7 - 10.3 mg/dL 9.5   8.9   Total Protein 6.5 - 8.1 g/dL   7.0   Total Bilirubin 0.3 - 1.2 mg/dL   0.6   Alkaline Phos 38 - 126 U/L   68   AST 15 - 41 U/L   20   ALT 14 - 54 U/L   31     Lipid Panel     Component Value Date/Time   CHOL 255 (H) 11/29/2019 0959   TRIG 189 (H) 11/29/2019 0959   HDL 64 11/29/2019 0959   CHOLHDL 4.0 11/29/2019 0959   LDLCALC 157 (H) 11/29/2019 0959    CBC  Component Value  Date/Time   WBC 5.0 02/14/2015 1305   RBC 4.60 02/14/2015 1305   HGB 12.7 02/14/2015 1305   HCT 38.8 02/14/2015 1305   PLT 244 02/14/2015 1305   MCV 84.3 02/14/2015 1305   MCH 27.6 02/14/2015 1305   MCHC 32.7 02/14/2015 1305   RDW 13.1 02/14/2015 1305   LYMPHSABS 2.2 02/14/2015 1305   MONOABS 0.5 02/14/2015 1305   EOSABS 0.2 02/14/2015 1305   BASOSABS 0.0 02/14/2015 1305    Lab Results  Component Value Date   HGBA1C 8.7 (H) 11/29/2019    Assessment & Plan:  1. Type 2 diabetes mellitus without complication, without long-term current use of insulin (HCC) Controlled with A1c of 8.0 Metformin increased from 500 mg twice daily to 1000 mg twice daily Referred for the [name] study. Counseled on Diabetic diet, my plate method, 191 minutes of moderate intensity exercise/week Blood sugar logs with fasting goals of 80-120 mg/dl, random of less than 478 and in the event of sugars less than 60 mg/dl or greater than 295 mg/dl encouraged to notify the clinic. Advised on the need for annual eye exams, annual foot exams, Pneumonia vaccine.  - POCT glycosylated hemoglobin (Hb A1C) - Microalbumin / creatinine urine ratio - LP+Non-HDL Cholesterol - CMP14+EGFR - AMB Referral to Pharmacy Medication Management - metFORMIN (GLUCOPHAGE) 500 MG tablet; Take 2 tablets (1,000 mg total) by mouth 2 (two) times daily with a meal.  Dispense: 360 tablet; Refill: 1  2. Essential hypertension Controlled Counseled on blood pressure goal of less than 130/80, low-sodium, DASH diet, medication compliance, 150 minutes of moderate intensity exercise per week. Discussed medication compliance, adverse effects. - amLODipine (NORVASC) 5 MG tablet; Take 1 tablet (5 mg total) by mouth daily.  Dispense: 30 tablet; Refill: 1  3. Hyperlipidemia, unspecified hyperlipidemia type Controlled Statin initiated Low-cholesterol diet - atorvastatin (LIPITOR) 20 MG tablet; Take 1 tablet (20 mg total) by mouth daily.  Dispense:  90 tablet; Refill: 1    Health Care Maintenance: Screening for viral disease performed.  Will address other health maintenance topics at next visit. Meds ordered this encounter  Medications   atorvastatin (LIPITOR) 20 MG tablet    Sig: Take 1 tablet (20 mg total) by mouth daily.    Dispense:  90 tablet    Refill:  1   amLODipine (NORVASC) 5 MG tablet    Sig: Take 1 tablet (5 mg total) by mouth daily.    Dispense:  30 tablet    Refill:  1   metFORMIN (GLUCOPHAGE) 500 MG tablet    Sig: Take 2 tablets (1,000 mg total) by mouth 2 (two) times daily with a meal.    Dispense:  360 tablet    Refill:  1    Dose increased    Follow-up: Return in about 3 months (around 01/20/2023) for Chronic medical conditions.       Hoy Register, MD, FAAFP. [facility] [place], [place] [phone]   10/20/2022, 9:24 AM

## 2022-10-20 NOTE — Telephone Encounter (Signed)
[name] Study  Received referral for patient participation in the [name] CGM Study. Contacted patient to discuss study and confirmed HIPAA identifiers. Confirmed patient was provided the [name] Study Information Sheet and any questions were answered.   Confirmed that patient meets study criteria by having a diagnosis of Type 2 Diabetes, is not currently on insulin, and most recent A1c is >8%.  Patient provided verbal consent to participate in the study. Consent documented in electronic medical record.   @CGMFLO @  - Confirmed that patient has a compatible smart phone to [employer] 3 app. ([email or web address]) - Asked to download and create a [name] account prior to first study visit.  - Scheduled first study visit on 10/26/2022. Confirmed patient has transportation to this appointment.  - Discussed use of MyChart in this study. Confirmed patient has an active MyChart account and is aware of their log in information.  Patient aware of pre-visit questionnaire that will be sent 2 days prior to their scheduled study visit and they will plan to complete before the visit.    Butch Penny, PharmD, [place], CPP Clinical Pharmacist [facility] & [facility] [phone]

## 2022-10-21 LAB — MICROALBUMIN / CREATININE URINE RATIO
Creatinine, Urine: 75.9 mg/dL
Microalb/Creat Ratio: 19 mg/g creat (ref 0–29)
Microalbumin, Urine: 14.4 ug/mL

## 2022-10-22 LAB — HCV RT-PCR, QUANT (NON-GRAPH): Hepatitis C Quantitation: NOT DETECTED IU/mL

## 2022-10-22 LAB — LP+NON-HDL CHOLESTEROL
Cholesterol, Total: 238 mg/dL — ABNORMAL HIGH (ref 100–199)
HDL: 74 mg/dL (ref >39)
LDL Chol Calc (NIH): 146 mg/dL — ABNORMAL HIGH (ref 0–99)
Total Non-HDL-Chol (LDL+VLDL): 164 mg/dL — ABNORMAL HIGH (ref 0–129)
Triglycerides: 101 mg/dL (ref 0–149)
VLDL Cholesterol Cal: 18 mg/dL (ref 5–40)

## 2022-10-22 LAB — CMP14+EGFR
ALT: 34 IU/L — ABNORMAL HIGH (ref 0–32)
AST: 17 IU/L (ref 0–40)
Albumin/Globulin Ratio: 1.6 (ref 1.2–2.2)
Albumin: 4.4 g/dL (ref 3.9–4.9)
Alkaline Phosphatase: 78 IU/L (ref 44–121)
BUN/Creatinine Ratio: 16 (ref 12–28)
BUN: 10 mg/dL (ref 8–27)
Bilirubin Total: 0.4 mg/dL (ref 0.0–1.2)
CO2: 22 mmol/L (ref 20–29)
Calcium: 9.4 mg/dL (ref 8.7–10.3)
Chloride: 100 mmol/L (ref 96–106)
Creatinine, Ser: 0.63 mg/dL (ref 0.57–1.00)
Globulin, Total: 2.8 g/dL (ref 1.5–4.5)
Glucose: 164 mg/dL — ABNORMAL HIGH (ref 70–99)
Potassium: 4.2 mmol/L (ref 3.5–5.2)
Sodium: 138 mmol/L (ref 134–144)
Total Protein: 7.2 g/dL (ref 6.0–8.5)
eGFR: 99 mL/min/{1.73_m2} (ref >59)

## 2022-10-22 LAB — HIV ANTIBODY (ROUTINE TESTING W REFLEX): HIV Screen 4th Generation wRfx: NONREACTIVE

## 2022-10-22 LAB — HCV AB W REFLEX TO QUANT PCR: HCV Ab: REACTIVE — AB

## 2022-10-23 ENCOUNTER — Encounter: Payer: Self-pay | Admitting: Family Medicine

## 2022-10-23 DIAGNOSIS — Z8619 Personal history of other infectious and parasitic diseases: Secondary | ICD-10-CM | POA: Insufficient documentation

## 2022-10-26 ENCOUNTER — Ambulatory Visit: Payer: Self-pay | Attending: Family Medicine | Admitting: Pharmacist

## 2022-10-26 ENCOUNTER — Encounter: Payer: Self-pay | Admitting: Pharmacist

## 2022-10-26 DIAGNOSIS — E119 Type 2 diabetes mellitus without complications: Secondary | ICD-10-CM

## 2022-10-26 LAB — POCT GLYCOSYLATED HEMOGLOBIN (HGB A1C): HbA1c, POC (controlled diabetic range): 8.1 % — AB (ref 0.0–7.0)

## 2022-10-26 NOTE — Progress Notes (Signed)
    S:    Chief Complaint  Patient presents with   Research    [name]   62 y.o. female who presents for diabetes evaluation, education, and management in the context of the [name] Study.   PMH is significant for HTN, HLD, and T2Dm.  Patient was referred and last seen by Primary Care Provider, Dr. Alvis Lemmings, on 10/20/2022. At last visit, A1c was 8% and metformin was increased to 1000 mg BID.   Today, patient arrives in good spirits and presents with the assistance of an online interpretor.   Current diabetes medications include: metformin 1000 mg BID Current hypertension medications include: amlodipine 5 mg daily Current hyperlipidemia medications include: atorvastatin 20 mg daily  Patient reports adherence to taking all medications as prescribed.   Insurance coverage: None  Patient denies hypoglycemic events.  Patient denies nocturia (nighttime urination).  Patient denies neuropathy (nerve pain). Patient denies visual changes. Patient reports self foot exams.    O:  ROS  Physical Exam   Lab Results  Component Value Date   HGBA1C 8.1 (A) 10/26/2022   POC A1c Today: 8.1%  There were no vitals filed for this visit.   Lipid Panel     Component Value Date/Time   CHOL 238 (H) 10/20/2022 0945   TRIG 101 10/20/2022 0945   HDL 74 10/20/2022 0945   CHOLHDL 4.0 11/29/2019 0959   LDLCALC 146 (H) 10/20/2022 0945    Clinical Atherosclerotic Cardiovascular Disease (ASCVD): No  The 10-year ASCVD risk score (Arnett DK, et al., 2019) is: 10.1%   Values used to calculate the score:     Age: 89 years     Sex: Female     Is Non-Hispanic African American: No     Diabetic: Yes     Tobacco smoker: No     Systolic Blood Pressure: 116 mmHg     Is BP treated: Yes     HDL Cholesterol: 74 mg/dL     Total Cholesterol: 238 mg/dL     A/P: [name] Study:  -Patient provided verbal consent to participate in the study. Consent documented in electronic medical record.  -Provided  education on Libre 3 CGM. Collaborated to ensure [name] 3 app was downloaded on patient's phone. Educated on how to place sensor every 14 days, patient placed first sensor correctly and verbalized understanding of use, removal, and how to place next sensor. Discussed alarms. 8 sensors provided for a 3 month supply. Educated to contact the office if the sensor falls off early and replacements are needed before their next [employer].    Diabetes longstanding, currently slightly above goal based on A1c. Patient is able to verbalize appropriate hypoglycemia management plan. Medication adherence appears appropriate. -Continued metformin 1000 mg BID. -May consider addition on SGLT2i or GLP in the future for renal/cardiovascular protection -Extensively discussed pathophysiology of diabetes, recommended lifestyle interventions, dietary effects on blood sugar control.  -Counseled on s/sx of and management of hypoglycemia.  -Next A1c anticipated 01/2023.   Written patient instructions provided. Patient verbalized understanding of treatment plan.  Total time in face to face counseling 30 minutes.    Follow-up:  Pharmacist 1 month. PCP clinic visit in 11/26/2022.   Valeda Malm, Pharm.D. PGY-2 Ambulatory Care Pharmacy Resident

## 2022-10-27 NOTE — Addendum Note (Signed)
Addended by: Lois Huxley, Jeannett Senior L on: 10/27/2022 08:34 AM   Modules accepted: Level of Service

## 2022-11-02 ENCOUNTER — Telehealth: Payer: Self-pay | Admitting: Pharmacist

## 2022-11-02 NOTE — Telephone Encounter (Signed)
[name] Study  Patient completed first study visit for the [name] CGM Study. Contacted patient to discuss CGM tolerability. Called both her's and her daughter's phone with no success. Left HIPAA-compliant VM with instructions to return my call.   Butch Penny, PharmD, [place], CPP Clinical Pharmacist [facility] [phone]

## 2022-11-26 ENCOUNTER — Ambulatory Visit: Payer: Self-pay | Attending: Family Medicine | Admitting: Pharmacist

## 2022-11-26 DIAGNOSIS — E785 Hyperlipidemia, unspecified: Secondary | ICD-10-CM | POA: Insufficient documentation

## 2022-11-26 DIAGNOSIS — Z7984 Long term (current) use of oral hypoglycemic drugs: Secondary | ICD-10-CM

## 2022-11-26 DIAGNOSIS — B1920 Unspecified viral hepatitis C without hepatic coma: Secondary | ICD-10-CM | POA: Insufficient documentation

## 2022-11-26 DIAGNOSIS — Z713 Dietary counseling and surveillance: Secondary | ICD-10-CM | POA: Insufficient documentation

## 2022-11-26 DIAGNOSIS — E119 Type 2 diabetes mellitus without complications: Secondary | ICD-10-CM | POA: Insufficient documentation

## 2022-11-26 DIAGNOSIS — I10 Essential (primary) hypertension: Secondary | ICD-10-CM | POA: Insufficient documentation

## 2022-11-26 NOTE — Progress Notes (Signed)
S:     No chief complaint on file.  62 y.o. female who presents for diabetes evaluation, education, and management.  PMH is significant for T2DM, HTN, HLD, hx Hep C.   Patient was referred and last seen by Primary Care Provider, Dr. Hoy Register, on 10/20/2022. At last visit, patient established care. A1c was 8%, down from 8.7% and sugar was 164, up from 149. Metformin 500 mg BID was increased to 1000 mg BID. Last seen by pharmacy clinic on 10/26/2022. At this visit, pharmacist provided education on how to use Libre 3 CGM and place sensors every 14 days, patient in [place] study.  Lipid panel on 5/7 resulted in elevated cholesterol 238 and elevated LDL 146. Atorvastatin 20 mg daily was started.  Today, patient arrives in good spirits and presents with assistance with spanish interpretor Thyra Breed [phone]. Patient reports diabetes was diagnosed in June 2021. Patient reports taking Metformin 500 mg daily in the morning. Home fasting blood sugars in the 130-150s. Post meal blood sugars in the >200s due to patient eating a high cultural diet. Patient denies hypoglycemic events, nocturia (nighttime urination), neuropathy (nerve pain), and visual changes. Patient reports self foot exams. Adherent to other medications as directed.  Current diabetes medications include: metformin 1000 mg BID (taking 500 mg daily) Current hypertension medications include: amlodipine 5 mg daily Current hyperlipidemia medications include: atorvastatin 20 mg daily  Family/Social History:  Fhx: HTN, stroke, cancer Tobacco: never smoker  Insurance coverage: [place] Medicaid Family Planning   O:    30 Day Average Glucose: 147 mg/dL Observed patterns: blood sugars elevated after patient eats a large dinner   Lab Results  Component Value Date   HGBA1C 8.1 (A) 10/26/2022   There were no vitals filed for this visit.  Lipid Panel     Component Value Date/Time   CHOL 238 (H) 10/20/2022 0945   TRIG 101 10/20/2022  0945   HDL 74 10/20/2022 0945   CHOLHDL 4.0 11/29/2019 0959   LDLCALC 146 (H) 10/20/2022 0945    Clinical Atherosclerotic Cardiovascular Disease (ASCVD): No  The 10-year ASCVD risk score (Arnett DK, et al., 2019) is: 10.1%   Values used to calculate the score:     Age: 56 years     Sex: Female     Is Non-Hispanic African American: No     Diabetic: Yes     Tobacco smoker: No     Systolic Blood Pressure: 116 mmHg     Is BP treated: Yes     HDL Cholesterol: 74 mg/dL     Total Cholesterol: 238 mg/dL   Patient is participating in a Managed Medicaid Plan: No   A/P: Diabetes longstanding currently close to goal. Patient is able to verbalize appropriate hypoglycemia management plan. Control is suboptimal due to poor medication adherence and diet. -Increased metformin 500 mg BID. Informed patient to take 2 tablets from the recently picked bottle. -Patient educated on purpose, proper use, and potential GI effects of metformin.  -Extensively discussed pathophysiology of diabetes, recommended lifestyle interventions, dietary effects on blood sugar control.  -Counseled on s/sx of and management of hypoglycemia.  -Next A1c anticipated August 2024.   ASCVD risk - primary prevention in patient with diabetes. Last LDL is 146 which is not at goal of <70 mg/dL. ASCVD risk factors include diabetes and 10-year ASCVD risk score of 10.1%. moderate intensity statin indicated.  -Continued atorvastatin 20 mg daily.   Hypertension longstanding currently controlled. Blood pressure goal of <130/80 mmHg.  Medication adherence optimal. -Continued amlodipine 5 mg daily.  Written patient instructions provided. Patient verbalized understanding of treatment plan.  Total time in face to face counseling 30 minutes.    Follow-up:  Pharmacist in 1 month.   Patient seen with  Alesia Banda, PharmD Candidate [employer] 2025  Butch Penny, PharmD, [place], CPP Clinical Pharmacist [facility] &  [facility] [phone]

## 2022-12-28 ENCOUNTER — Ambulatory Visit: Payer: Medicaid Other | Admitting: Pharmacist

## 2023-01-21 ENCOUNTER — Ambulatory Visit: Payer: Medicaid Other | Attending: Family Medicine | Admitting: Pharmacist

## 2023-01-21 ENCOUNTER — Other Ambulatory Visit (HOSPITAL_BASED_OUTPATIENT_CLINIC_OR_DEPARTMENT_OTHER): Payer: Self-pay

## 2023-01-21 ENCOUNTER — Other Ambulatory Visit: Payer: Self-pay

## 2023-01-21 ENCOUNTER — Ambulatory Visit: Payer: Self-pay | Attending: Family Medicine | Admitting: Family Medicine

## 2023-01-21 DIAGNOSIS — E119 Type 2 diabetes mellitus without complications: Secondary | ICD-10-CM

## 2023-01-21 DIAGNOSIS — E785 Hyperlipidemia, unspecified: Secondary | ICD-10-CM | POA: Insufficient documentation

## 2023-01-21 DIAGNOSIS — Z23 Encounter for immunization: Secondary | ICD-10-CM

## 2023-01-21 DIAGNOSIS — Z7984 Long term (current) use of oral hypoglycemic drugs: Secondary | ICD-10-CM | POA: Insufficient documentation

## 2023-01-21 DIAGNOSIS — I10 Essential (primary) hypertension: Secondary | ICD-10-CM | POA: Insufficient documentation

## 2023-01-21 LAB — POCT GLYCOSYLATED HEMOGLOBIN (HGB A1C): HbA1c, POC (controlled diabetic range): 7.3 % — AB (ref 0.0–7.0)

## 2023-01-21 MED ORDER — ATORVASTATIN CALCIUM 20 MG PO TABS: 20.0000 mg | ORAL_TABLET | Freq: Every day | ORAL | 1 refills | Status: DC

## 2023-01-21 MED ORDER — AMLODIPINE BESYLATE 5 MG PO TABS
5.0000 mg | ORAL_TABLET | Freq: Every day | ORAL | 1 refills | Status: DC
  Filled 2023-01-21: qty 90, 90d supply, fill #0
  Filled 2023-05-03: qty 90, 90d supply, fill #1

## 2023-01-21 MED ORDER — AMLODIPINE BESYLATE 5 MG PO TABS: 5.0000 mg | ORAL_TABLET | Freq: Every day | ORAL | 1 refills | Status: DC

## 2023-01-21 MED ORDER — METFORMIN HCL 500 MG PO TABS: 1000.0000 mg | ORAL_TABLET | Freq: Two times a day (BID) | ORAL | 1 refills | Status: DC

## 2023-01-21 MED ORDER — ATORVASTATIN CALCIUM 20 MG PO TABS
20.0000 mg | ORAL_TABLET | Freq: Every day | ORAL | 1 refills | Status: DC
  Filled 2023-01-21: qty 90, 90d supply, fill #0
  Filled 2023-05-03: qty 90, 90d supply, fill #1

## 2023-01-21 MED ORDER — METFORMIN HCL 500 MG PO TABS
1000.0000 mg | ORAL_TABLET | Freq: Two times a day (BID) | ORAL | 1 refills | Status: DC
  Filled 2023-01-21: qty 360, 90d supply, fill #0

## 2023-01-21 NOTE — Patient Instructions (Signed)
Zoster Vaccine Injection Qu es este medicamento? LA VACUNA CONTRA EL ZSTER reduce el riesgo de herpes zster (culebrilla). No trata la culebrilla. Contina siendo posible contraer culebrilla despus de recibir la vacuna, pero los sntomas podran ser menos graves o durar menos [place]. Acta Etta Quill al sistema inmunolgico a aprender a Industrial/product designer una futura infeccin. Este medicamento puede ser utilizado para otros usos; si tiene alguna pregunta consulte con su proveedor de atencin mdica o con su farmacutico. MARCAS COMUNES: SHINGRIX Qu le debo informar a mi profesional de la salud antes de tomar este medicamento? Necesitan saber si usted presenta alguno de los siguientes problemas o situaciones: Database administrator Problemas del sistema inmunolgico Una reaccin alrgica o inusual a la vacuna Special educational needs teacher, a otros medicamentos, alimentos, colorantes o conservantes Si est embarazada o buscando quedar embarazada Si est amamantando a un beb Cmo debo Visual merchandiser medicamento? Esta vacuna se inyecta en un msculo. Lo administra su equipo de atencin. Se requieren 2 dosis de esta vacuna para obtener todos los beneficios. Establezca un recordatorio para la fecha en que debe aplicarse la segunda dosis. Recibir una copia de informacin escrita sobre la vacuna antes de cada vacuna. Asegrese de leer esta informacin cada vez cuidadosamente. Esta informacin puede cambiar frecuentemente. Hable con su equipo de atencin sobre el uso de esta vacuna en nios. Esta vacuna no est aprobado para uso en nios. Sobredosis: Pngase en contacto inmediatamente con un centro toxicolgico o una sala de urgencia si usted cree que haya tomado demasiado medicamento.<br>ATENCIN: [employer] es solo para usted. No comparta este medicamento con nadie. Qu sucede si me olvido de una dosis? Cumpla con las citas para dosis de seguimiento (refuerzo). Es importante no olvidar ninguna dosis. Llame a su equipo de  atencin si no puede asistir a una cita. Qu puede interactuar con este medicamento? Medicamentos que suprimen el sistema inmunolgico Medicamentos para tratar Buyer, retail esteroideos, tales como la prednisona o la cortisona Puede ser que esta lista no menciona todas las posibles interacciones. Informe a su profesional de [employer] de [employer] productos a base de hierbas, medicamentos de [employer]ent o suplementos nutritivos que est tomando. Si usted fuma, consume bebidas alcohlicas o si utiliza drogas ilegales, indqueselo tambin a su profesional de [employer]. Algunas sustancias pueden interactuar con su medicamento. A qu debo estar atento al usar [employer]? Visite peridicamente a su equipo de atencin. Es posible que esta vacuna, como todas las vacunas, no proteja completamente a todos. Qu efectos secundarios puedo tener al [employer] este medicamento? Efectos secundarios que debe informar a su equipo de atencin tan pronto como sea posible: Reacciones alrgicas: erupcin cutnea, comezn/picazn, urticaria, hinchazn de la cara, los labios, la lengua o la garganta Efectos secundarios que generalmente no requieren atencin mdica (debe informarlos a su equipo de atencin si persisten o si son molestos): Escalofros Warehouse manager de Research scientist (medical) o aturdimiento Teacher, English as a foreign language Dolor de Metallurgist, enrojecimiento o Marketing executive de la inyeccin Puede ser que esta lista no menciona todos los posibles efectos secundarios. Comunquese a su mdico por asesoramiento mdico [employer]. Usted puede informar los efectos secundarios a la FDA por telfono al 1-800-FDA-1088. Dnde debo guardar mi medicina? Esta vacuna la administra solamente su equipo de atencin. No se guarda en su casa. <b>ATENCIN: Este folleto es un resumen. Puede ser que no cubra toda la posible informacin. Si usted tiene preguntas acerca de esta medicina, consulte con su  mdico, su farmacutico o su  profesional de [employer].</b>  2024 Elsevier/Gold Standard (2022-04-22 00:00:00)

## 2023-01-21 NOTE — Research (Signed)
    S:     No chief complaint on file.  62 y.o. female who presents for diabetes evaluation, education, and management in the context of the [name] Study.  PMH is significant for T2DM, HTN, HLD, hx Hep C.  Patient was referred and last seen by Primary Care Provider, Dr. Alvis Lemmings, today.   Today, patient arrives in good spirits and presents without any assistance.   Patient reports Diabetes was diagnosed in June, 2021.   Family/Social History:  Fhx: HTN, stroke, cancer Tobacco: never smoker  Current diabetes medications include: metformin 500 mg tablets - takes 2 tabs (1000mg  total) BID Patient reports adherence to taking all medications as prescribed.   Insurance coverage: [place] Medicaid  Patient denies hypoglycemic events.  Patient denies nocturia (nighttime urination).  Patient denies neuropathy (nerve pain). Patient denies visual changes. Patient reports self foot exams.   Patient reported dietary habits: -Trying to limit carbs, starches, and sweets. -Her [name] is helping her track her diet better   Patient-reported exercise habits: none   O:   Date of Download: 7/26 - 01/21/2023 % Time CGM is active: 91% Average Glucose: 129 mg/dL Glucose Management Indicator: 6.4%  Glucose Variability: 27.7 (goal <36%) Time in Goal:  - Time in range 70-180: 90% - Time above range: 10% - Time below range: 0% Observed patterns:   Lab Results  Component Value Date   HGBA1C 7.3 (A) 01/21/2023    There were no vitals filed for this visit.   Lipid Panel     Component Value Date/Time   CHOL 238 (H) 10/20/2022 0945   TRIG 101 10/20/2022 0945   HDL 74 10/20/2022 0945   CHOLHDL 4.0 11/29/2019 0959   LDLCALC 146 (H) 10/20/2022 0945    Clinical Atherosclerotic Cardiovascular Disease (ASCVD): No  The 10-year ASCVD risk score (Arnett DK, et al., 2019) is: 14.2%   Values used to calculate the score:     Age: 80 years     Sex: Female     Is Non-Hispanic African American: No      Diabetic: Yes     Tobacco smoker: No     Systolic Blood Pressure: 139 mmHg     Is BP treated: Yes     HDL Cholesterol: 74 mg/dL     Total Cholesterol: 238 mg/dL   Patient is participating in a Managed Medicaid Plan: no     A/P:  [name] Study:  - 8 sensors provided for a 3 month supply. Educated to contact the office if the sensor falls off early and replacements are needed before their next [employer].   Diabetes longstanding currently close to goal. A1c in clinic is a little higher than her GMI. Patient is able to verbalize appropriate hypoglycemia management plan. Medication adherence appears appropriate. GMI over last 2 weeks is 6.4% while GMI over last 90-days is estimated at 6.7%. -Continued with metformin monotherapy at this time.  -Patient educated on purpose, proper use, and potential adverse effects of metformin.  -Extensively discussed pathophysiology of diabetes, recommended lifestyle interventions, dietary effects on blood sugar control.  -Counseled on s/sx of and management of hypoglycemia.  -Next A1c anticipated 04/2023.   Written patient instructions provided. Patient verbalized understanding of treatment plan.  Total time in face to face counseling 30 minutes.    Follow-up:  Pharmacist in 1 month.  Butch Penny, PharmD, Patsy Baltimore, CPP Clinical Pharmacist [facility] [phone]

## 2023-01-21 NOTE — Progress Notes (Signed)
Subjective:  Patient ID: Deborah Lyons, female    DOB: 1960-11-01  Age: 62 y.o. MRN: [id number]  CC: Medical Management of Chronic Issues   HPI Deborah Lyons is a 62 y.o. year old female with a history of Type 2 DM (A1c 7.3), HTN, Hyperlipidemia.   Interval History: Discussed the use of AI scribe software for clinical note transcription with the patient, who gave verbal consent to proceed.  She presents for a follow-up visit. She reports no new symptoms or concerns. Her diabetes has been well-controlled with metformin 1000mg  twice daily, as evidenced by a decrease in A1c from 8.0 to 7.3.  She is currently enrolled in the [name] Study. She is also on amlodipine for hypertension and atorvastatin 20mg  for hyperlipidemia.  At her last visit, her cholesterol was elevated, and atorvastatin was initiated. She does not exercise regularly. Accompanied by daughter to today's visit and denies presence of additional concerns.   Past Medical History:  Diagnosis Date   Diabetes mellitus without complication (HCC)    Hypertension     Past Surgical History:  Procedure Laterality Date   arm surgery Left 06/2019   surgery to remove fat tissue (lump) done in [place]   CESAREAN SECTION      Family History  Problem Relation Age of Onset   Hypertension Mother    Breast cancer Sister    Stroke Brother     Social History   Socioeconomic History   Marital status: Married    Spouse name: Not on file   Number of children: 4   Years of education: Not on file   Highest education level: 3rd grade  Occupational History   Not on file  Tobacco Use   Smoking status: Never   Smokeless tobacco: Never  Vaping Use   Vaping status: Never Used  Substance and Sexual Activity   Alcohol use: No   Drug use: No   Sexual activity: Not Currently    Birth control/protection: Post-menopausal  Other Topics Concern   Not on file  Social History Narrative   Not on file   Social  Determinants of Health   Financial Resource Strain: Not on file  Food Insecurity: Not on file  Transportation Needs: No Transportation Needs (11/29/2019)   PRAPARE - Administrator, Civil Service (Medical): No    Lack of Transportation (Non-Medical): No  Recent Concern: Transportation Needs - Unmet Transportation Needs (11/07/2019)   PRAPARE - Transportation    Lack of Transportation (Medical): Yes    Lack of Transportation (Non-Medical): Yes  Physical Activity: Inactive (07/13/2017)   Exercise Vital Sign    Days of Exercise per Week: 0 days    Minutes of Exercise per Session: 0 min  Stress: No Stress Concern Present (07/13/2017)   [employer] of Occupational Health - Occupational Stress Questionnaire    Feeling of Stress : Not at all  Social Connections: Somewhat Isolated (07/13/2017)   Social Connection and Isolation Panel [NHANES]    Frequency of Communication with Friends and Family: More than three times a week    Frequency of Social Gatherings with Friends and Family: More than three times a week    Attends Religious Services: 1 to 4 times per year    Active Member of [employer] or Organizations: No    Attends Banker Meetings: Never    Marital Status: Separated    No Known Allergies  Outpatient Medications Prior to Visit  Medication Sig Dispense Refill  Blood Pressure Monitoring (BLOOD PRESSURE CUFF) MISC Take blood pressure 3-4 times weekly and write down numbers in a notebook to bring to primary care doctor appointment. 1 each 0   amLODipine (NORVASC) 5 MG tablet Take 1 tablet (5 mg total) by mouth daily. 30 tablet 1   atorvastatin (LIPITOR) 20 MG tablet Take 1 tablet (20 mg total) by mouth daily. 90 tablet 1   metFORMIN (GLUCOPHAGE) 500 MG tablet Take 2 tablets (1,000 mg total) by mouth 2 (two) times daily with a meal. 360 tablet 1   No facility-administered medications prior to visit.     ROS Review of Systems  Constitutional:  Negative for  activity change and appetite change.  HENT:  Negative for sinus pressure and sore throat.   Respiratory:  Negative for chest tightness, shortness of breath and wheezing.   Cardiovascular:  Negative for chest pain and palpitations.  Gastrointestinal:  Negative for abdominal distention, abdominal pain and constipation.  Genitourinary: Negative.   Musculoskeletal: Negative.   Psychiatric/Behavioral:  Negative for behavioral problems and dysphoric mood.     Objective:  BP 139/69   Pulse 63   Temp 98 F (36.7 C) (Oral)   Ht 5' (1.524 m)   Wt 128 lb 3.2 oz (58.2 kg)   SpO2 98%   BMI 25.04 kg/m      01/21/2023    9:31 AM 10/20/2022    9:09 AM 02/09/2022    8:24 AM  BP/Weight  Systolic BP 139 116 158  Diastolic BP 69 75 79  Wt. (Lbs) 128.2 135.2   BMI 25.04 kg/m2 26.4 kg/m2       Physical Exam Constitutional:      Appearance: She is well-developed.  Cardiovascular:     Rate and Rhythm: Normal rate.     Heart sounds: Normal heart sounds. No murmur heard. Pulmonary:     Effort: Pulmonary effort is normal.     Breath sounds: Normal breath sounds. No wheezing or rales.  Chest:     Chest wall: No tenderness.  Abdominal:     General: Bowel sounds are normal. There is no distension.     Palpations: Abdomen is soft. There is no mass.     Tenderness: There is no abdominal tenderness.  Musculoskeletal:        General: Normal range of motion.     Right lower leg: No edema.     Left lower leg: No edema.  Neurological:     Mental Status: She is alert and oriented to person, place, and time.  Psychiatric:        Mood and Affect: Mood normal.        Latest Ref Rng & Units 10/20/2022    9:45 AM 12/12/2019   12:07 PM 11/29/2019    9:59 AM  CMP  Glucose 70 - 99 mg/dL 161  096  045   BUN 8 - 27 mg/dL 10  12    Creatinine 4.09 - 1.00 mg/dL 8.11  9.14    Sodium 782 - 144 mmol/L 138  138    Potassium 3.5 - 5.2 mmol/L 4.2  4.3    Chloride 96 - 106 mmol/L 100  101    CO2 20 - 29  mmol/L 22  23    Calcium 8.7 - 10.3 mg/dL 9.4  9.5    Total Protein 6.0 - 8.5 g/dL 7.2     Total Bilirubin 0.0 - 1.2 mg/dL 0.4     Alkaline Phos 44 - 121 IU/L  78     AST 0 - 40 IU/L 17     ALT 0 - 32 IU/L 34       Lipid Panel     Component Value Date/Time   CHOL 238 (H) 10/20/2022 0945   TRIG 101 10/20/2022 0945   HDL 74 10/20/2022 0945   CHOLHDL 4.0 11/29/2019 0959   LDLCALC 146 (H) 10/20/2022 0945    CBC    Component Value Date/Time   WBC 5.0 02/14/2015 1305   RBC 4.60 02/14/2015 1305   HGB 12.7 02/14/2015 1305   HCT 38.8 02/14/2015 1305   PLT 244 02/14/2015 1305   MCV 84.3 02/14/2015 1305   MCH 27.6 02/14/2015 1305   MCHC 32.7 02/14/2015 1305   RDW 13.1 02/14/2015 1305   LYMPHSABS 2.2 02/14/2015 1305   MONOABS 0.5 02/14/2015 1305   EOSABS 0.2 02/14/2015 1305   BASOSABS 0.0 02/14/2015 1305    Lab Results  Component Value Date   HGBA1C 7.3 (A) 01/21/2023    Assessment & Plan:      Type 2 Diabetes Mellitus Improved glycemic control with a decrease in HbA1c from 8.0 to 7.3. Currently on Metformin 1000mg  twice daily. -Continue Metformin 1000mg  twice daily. -Encourage regular exercise for further glycemic control. -Counseled on Diabetic diet, my plate method, 161 minutes of moderate intensity exercise/week Blood sugar logs with fasting goals of 80-120 mg/dl, random of less than 096 and in the event of sugars less than 60 mg/dl or greater than 045 mg/dl encouraged to notify the clinic. Advised on the need for annual eye exams, annual foot exams, Pneumonia vaccine.   Hypertension Controlled on Amlodipine. -Continue Amlodipine.  Hyperlipidemia Last cholesterol level was high. Currently on Atorvastatin 20mg  daily. -Continue Atorvastatin 20mg  daily. -Return fasting for cholesterol recheck on Wednesday next week.  General Health Maintenance / Followup Plans -Refills for Metformin, Amlodipine, and Atorvastatin sent to the pharmacy. -Follow-up in 1 month for  complete physical exam including healthcare maintenance screenings         Meds ordered this encounter  Medications   DISCONTD: amLODipine (NORVASC) 5 MG tablet    Sig: Take 1 tablet (5 mg total) by mouth daily.    Dispense:  90 tablet    Refill:  1   DISCONTD: atorvastatin (LIPITOR) 20 MG tablet    Sig: Take 1 tablet (20 mg total) by mouth daily.    Dispense:  90 tablet    Refill:  1   DISCONTD: metFORMIN (GLUCOPHAGE) 500 MG tablet    Sig: Take 2 tablets (1,000 mg total) by mouth 2 (two) times daily with a meal.    Dispense:  360 tablet    Refill:  1    Dose increased   amLODipine (NORVASC) 5 MG tablet    Sig: Take 1 tablet (5 mg total) by mouth daily.    Dispense:  90 tablet    Refill:  1   atorvastatin (LIPITOR) 20 MG tablet    Sig: Take 1 tablet (20 mg total) by mouth daily.    Dispense:  90 tablet    Refill:  1   metFORMIN (GLUCOPHAGE) 500 MG tablet    Sig: Take 2 tablets (1,000 mg total) by mouth 2 (two) times daily with a meal.    Dispense:  360 tablet    Refill:  1    Dose increased    Follow-up: Return in about 1 month (around 02/21/2023) for CPE/ Preventive Health Exam.       Hoy Register,  MD, FAAFP. [facility] [place], [place] [phone]   01/21/2023, 9:44 AM

## 2023-01-27 ENCOUNTER — Ambulatory Visit: Payer: Medicaid Other | Attending: Family Medicine

## 2023-01-27 DIAGNOSIS — I10 Essential (primary) hypertension: Secondary | ICD-10-CM

## 2023-01-27 DIAGNOSIS — E785 Hyperlipidemia, unspecified: Secondary | ICD-10-CM

## 2023-01-28 LAB — CMP14+EGFR
ALT: 48 IU/L — ABNORMAL HIGH (ref 0–32)
AST: 19 IU/L (ref 0–40)
Albumin: 4.3 g/dL (ref 3.9–4.9)
Alkaline Phosphatase: 82 IU/L (ref 44–121)
BUN/Creatinine Ratio: 15 (ref 12–28)
BUN: 9 mg/dL (ref 8–27)
Bilirubin Total: 0.4 mg/dL (ref 0.0–1.2)
CO2: 25 mmol/L (ref 20–29)
Calcium: 9.5 mg/dL (ref 8.7–10.3)
Chloride: 102 mmol/L (ref 96–106)
Creatinine, Ser: 0.61 mg/dL (ref 0.57–1.00)
Globulin, Total: 3 g/dL (ref 1.5–4.5)
Glucose: 128 mg/dL — ABNORMAL HIGH (ref 70–99)
Potassium: 4.4 mmol/L (ref 3.5–5.2)
Sodium: 140 mmol/L (ref 134–144)
Total Protein: 7.3 g/dL (ref 6.0–8.5)
eGFR: 100 mL/min/{1.73_m2} (ref >59)

## 2023-01-28 LAB — LP+NON-HDL CHOLESTEROL
Cholesterol, Total: 145 mg/dL (ref 100–199)
HDL: 57 mg/dL (ref >39)
LDL Chol Calc (NIH): 70 mg/dL (ref 0–99)
Total Non-HDL-Chol (LDL+VLDL): 88 mg/dL (ref 0–129)
Triglycerides: 95 mg/dL (ref 0–149)
VLDL Cholesterol Cal: 18 mg/dL (ref 5–40)

## 2023-02-22 ENCOUNTER — Other Ambulatory Visit (HOSPITAL_COMMUNITY)
Admission: RE | Admit: 2023-02-22 | Discharge: 2023-02-22 | Disposition: A | Discharge status: Home / Self Care | Payer: Medicaid Other | Source: Ambulatory Visit | Attending: Family Medicine | Admitting: Family Medicine

## 2023-02-22 ENCOUNTER — Ambulatory Visit: Payer: Medicaid Other | Attending: Family Medicine | Admitting: Family Medicine

## 2023-02-22 VITALS — BP 129/85 | HR 75 | Ht 60.0 in | Wt 131.0 lb

## 2023-02-22 DIAGNOSIS — Z1231 Encounter for screening mammogram for malignant neoplasm of breast: Secondary | ICD-10-CM

## 2023-02-22 DIAGNOSIS — Z1211 Encounter for screening for malignant neoplasm of colon: Secondary | ICD-10-CM

## 2023-02-22 DIAGNOSIS — Z124 Encounter for screening for malignant neoplasm of cervix: Secondary | ICD-10-CM

## 2023-02-22 DIAGNOSIS — Z0000 Encounter for general adult medical examination without abnormal findings: Secondary | ICD-10-CM

## 2023-02-22 DIAGNOSIS — Z23 Encounter for immunization: Secondary | ICD-10-CM

## 2023-02-22 NOTE — Patient Instructions (Signed)

## 2023-02-22 NOTE — Progress Notes (Signed)
Subjective:  Patient ID: Deborah Lyons, female    DOB: 10-30-1960  Age: 62 y.o. MRN: [id number]  CC: Gynecologic Exam   HPI Deborah Lyons is a 62 y.o. year old female with a history of Type 2 DM (A1c 7.3), HTN, Hyperlipidemia.     Interval History: Discussed the use of AI scribe software for clinical note transcription with the patient, who gave verbal consent to proceed.  She reports a sedentary lifestyle and a diet lacking in fruits and vegetables. She does not engage in regular exercise, falling short of the recommended 150 minutes per week. Her diet is deficient in fruits and vegetables, with less than the recommended five servings per day. The patient does not regularly see a dentist or an eye doctor, which is particularly concerning given her diabetes diagnosis. The patient underwent a physical examination during the visit, but the results were not discussed in the conversation.        Past Medical History:  Diagnosis Date   Diabetes mellitus without complication (HCC)    Hypertension     Past Surgical History:  Procedure Laterality Date   arm surgery Left 06/2019   surgery to remove fat tissue (lump) done in [place]   CESAREAN SECTION      Family History  Problem Relation Age of Onset   Hypertension Mother    Breast cancer Sister    Stroke Brother     Social History   Socioeconomic History   Marital status: Married    Spouse name: Not on file   Number of children: 4   Years of education: Not on file   Highest education level: 3rd grade  Occupational History   Not on file  Tobacco Use   Smoking status: Never   Smokeless tobacco: Never  Vaping Use   Vaping status: Never Used  Substance and Sexual Activity   Alcohol use: No   Drug use: No   Sexual activity: Not Currently    Birth control/protection: Post-menopausal  Other Topics Concern   Not on file  Social History Narrative   Not on file   Social Determinants of Health    Financial Resource Strain: Not on file  Food Insecurity: Not on file  Transportation Needs: No Transportation Needs (11/29/2019)   PRAPARE - Administrator, Civil Service (Medical): No    Lack of Transportation (Non-Medical): No  Recent Concern: Transportation Needs - Unmet Transportation Needs (11/07/2019)   PRAPARE - Transportation    Lack of Transportation (Medical): Yes    Lack of Transportation (Non-Medical): Yes  Physical Activity: Inactive (07/13/2017)   Exercise Vital Sign    Days of Exercise per Week: 0 days    Minutes of Exercise per Session: 0 min  Stress: No Stress Concern Present (07/13/2017)   [employer] of Occupational Health - Occupational Stress Questionnaire    Feeling of Stress : Not at all  Social Connections: Somewhat Isolated (07/13/2017)   Social Connection and Isolation Panel [NHANES]    Frequency of Communication with Friends and Family: More than three times a week    Frequency of Social Gatherings with Friends and Family: More than three times a week    Attends Religious Services: 1 to 4 times per year    Active Member of [employer] or Organizations: No    Attends Banker Meetings: Never    Marital Status: Separated    No Known Allergies  Outpatient Medications Prior to Visit  Medication  Sig Dispense Refill   amLODipine (NORVASC) 5 MG tablet Take 1 tablet (5 mg total) by mouth daily. 90 tablet 1   atorvastatin (LIPITOR) 20 MG tablet Take 1 tablet (20 mg total) by mouth daily. 90 tablet 1   Blood Pressure Monitoring (BLOOD PRESSURE CUFF) MISC Take blood pressure 3-4 times weekly and write down numbers in a notebook to bring to primary care doctor appointment. 1 each 0   metFORMIN (GLUCOPHAGE) 500 MG tablet Take 2 tablets (1,000 mg total) by mouth 2 (two) times daily with a meal. 360 tablet 1   No facility-administered medications prior to visit.     ROS Review of Systems  Constitutional:  Negative for activity change,  appetite change and fatigue.  HENT:  Negative for congestion, sinus pressure and sore throat.   Eyes:  Negative for visual disturbance.  Respiratory:  Negative for cough, chest tightness, shortness of breath and wheezing.   Cardiovascular:  Negative for chest pain and palpitations.  Gastrointestinal:  Negative for abdominal distention, abdominal pain and constipation.  Endocrine: Negative for polydipsia.  Genitourinary:  Negative for dysuria and frequency.  Musculoskeletal:  Negative for arthralgias and back pain.  Skin:  Negative for rash.  Neurological:  Negative for tremors, light-headedness and numbness.  Hematological:  Does not bruise/bleed easily.  Psychiatric/Behavioral:  Negative for agitation and behavioral problems.     Objective:  BP 129/85   Pulse 75   Ht 5' (1.524 m)   Wt 131 lb (59.4 kg)   SpO2 99%   BMI 25.58 kg/m      02/22/2023    9:40 AM 02/22/2023    8:58 AM 01/21/2023    9:31 AM  BP/Weight  Systolic BP 129 144 139  Diastolic BP 85 75 69  Wt. (Lbs)  131 128.2  BMI  25.58 kg/m2 25.04 kg/m2      Physical Exam Exam conducted with a chaperone present.  Constitutional:      General: She is not in acute distress.    Appearance: She is well-developed. She is not diaphoretic.  HENT:     Head: Normocephalic.     Right Ear: External ear normal.     Left Ear: External ear normal.     Nose: Nose normal.  Eyes:     Conjunctiva/sclera: Conjunctivae normal.     Pupils: Pupils are equal, round, and reactive to light.  Neck:     Vascular: No JVD.  Cardiovascular:     Rate and Rhythm: Normal rate and regular rhythm.     Heart sounds: Normal heart sounds. No murmur heard.    No gallop.  Pulmonary:     Effort: Pulmonary effort is normal. No respiratory distress.     Breath sounds: Normal breath sounds. No wheezing or rales.  Chest:     Chest wall: No tenderness.  Breasts:    Right: Normal. No mass, nipple discharge or tenderness.     Left: Normal. No mass,  nipple discharge or tenderness.  Abdominal:     General: Bowel sounds are normal. There is no distension.     Palpations: Abdomen is soft. There is no mass.     Tenderness: There is no abdominal tenderness.     Hernia: There is no hernia in the left inguinal area or right inguinal area.  Genitourinary:    General: Normal vulva.     Pubic Area: No rash.      Labia:        Right: No rash.  Left: No rash.      Vagina: Normal.     Cervix: Normal.     Uterus: Normal.      Adnexa: Right adnexa normal and left adnexa normal.       Right: No tenderness.         Left: No tenderness.    Musculoskeletal:        General: No tenderness. Normal range of motion.     Cervical back: Normal range of motion. No tenderness.  Lymphadenopathy:     Upper Body:     Right upper body: No supraclavicular or axillary adenopathy.     Left upper body: No supraclavicular or axillary adenopathy.  Skin:    General: Skin is warm and dry.  Neurological:     Mental Status: She is alert and oriented to person, place, and time.     Deep Tendon Reflexes: Reflexes are normal and symmetric.        Latest Ref Rng & Units 01/27/2023    8:47 AM 10/20/2022    9:45 AM 12/12/2019   12:07 PM  CMP  Glucose 70 - 99 mg/dL 132  440  102   BUN 8 - 27 mg/dL 9  10  12    Creatinine 0.57 - 1.00 mg/dL 7.25  3.66  4.40   Sodium 134 - 144 mmol/L 140  138  138   Potassium 3.5 - 5.2 mmol/L 4.4  4.2  4.3   Chloride 96 - 106 mmol/L 102  100  101   CO2 20 - 29 mmol/L 25  22  23    Calcium 8.7 - 10.3 mg/dL 9.5  9.4  9.5   Total Protein 6.0 - 8.5 g/dL 7.3  7.2    Total Bilirubin 0.0 - 1.2 mg/dL 0.4  0.4    Alkaline Phos 44 - 121 IU/L 82  78    AST 0 - 40 IU/L 19  17    ALT 0 - 32 IU/L 48  34      Lipid Panel     Component Value Date/Time   CHOL 145 01/27/2023 0847   TRIG 95 01/27/2023 0847   HDL 57 01/27/2023 0847   CHOLHDL 4.0 11/29/2019 0959   LDLCALC 70 01/27/2023 0847    CBC    Component Value Date/Time   WBC  5.0 02/14/2015 1305   RBC 4.60 02/14/2015 1305   HGB 12.7 02/14/2015 1305   HCT 38.8 02/14/2015 1305   PLT 244 02/14/2015 1305   MCV 84.3 02/14/2015 1305   MCH 27.6 02/14/2015 1305   MCHC 32.7 02/14/2015 1305   RDW 13.1 02/14/2015 1305   LYMPHSABS 2.2 02/14/2015 1305   MONOABS 0.5 02/14/2015 1305   EOSABS 0.2 02/14/2015 1305   BASOSABS 0.0 02/14/2015 1305    Lab Results  Component Value Date   HGBA1C 7.3 (A) 01/21/2023    Assessment & Plan:      Annual Physical Exam Counseled on 150 minutes of exercise per week, healthy eating (including decreased daily intake of saturated fats, cholesterol, added sugars, sodium), STI prevention, routine healthcare maintenance. No regular eye examinations. -Recommend annual eye examination due to diabetes. -Recommend regular dental check-ups.          No orders of the defined types were placed in this encounter.   Follow-up: Return in about 6 months (around 08/22/2023) for Chronic medical conditions.       Hoy Register, MD, FAAFP. [facility] [place], [place] [phone]  02/22/2023, 9:52 AM

## 2023-02-24 ENCOUNTER — Ambulatory Visit: Payer: Medicaid Other

## 2023-02-24 LAB — CYTOLOGY - PAP
Chlamydia: NEGATIVE
Comment: NEGATIVE
Comment: NEGATIVE
Comment: NEGATIVE
Comment: NORMAL
Diagnosis: NEGATIVE
High risk HPV: NEGATIVE
Neisseria Gonorrhea: NEGATIVE
Trichomonas: NEGATIVE

## 2023-02-26 ENCOUNTER — Other Ambulatory Visit: Payer: Self-pay

## 2023-03-02 ENCOUNTER — Other Ambulatory Visit: Payer: Self-pay

## 2023-03-02 ENCOUNTER — Ambulatory Visit: Payer: Self-pay | Attending: Family Medicine | Admitting: Pharmacist

## 2023-03-02 ENCOUNTER — Encounter: Payer: Self-pay | Admitting: Pharmacist

## 2023-03-02 DIAGNOSIS — E119 Type 2 diabetes mellitus without complications: Secondary | ICD-10-CM | POA: Insufficient documentation

## 2023-03-02 DIAGNOSIS — E785 Hyperlipidemia, unspecified: Secondary | ICD-10-CM | POA: Insufficient documentation

## 2023-03-02 DIAGNOSIS — Z7984 Long term (current) use of oral hypoglycemic drugs: Secondary | ICD-10-CM | POA: Insufficient documentation

## 2023-03-02 DIAGNOSIS — I10 Essential (primary) hypertension: Secondary | ICD-10-CM | POA: Insufficient documentation

## 2023-03-02 DIAGNOSIS — Z713 Dietary counseling and surveillance: Secondary | ICD-10-CM | POA: Insufficient documentation

## 2023-03-02 NOTE — Progress Notes (Signed)
S:    62 y.o. female who presents for diabetes evaluation, education, and management. Of note, she is also enrolled in our [name] study. PMH is significant for T2DM, HTN, HLD, hx Hep C. Patient reports Diabetes was diagnosed 11/2019.   Patient was referred and last seen by PCP, Dr. Alvis Lemmings, on 02/22/2023. Last seen by pharmacist on 01/21/2023 for [name] #2.   At 01/21/23 visit, patient was encouraged to exercise 150 minutes per week and metformin was continued. Patient's CGM data reported time in range at 90%. A1c was obtained and was 7.3%.   Family/Social History:  Fhx: HTN, stroke, cancer Tobacco: never smoker  Insurance coverage: [place] Medicaid   Current diabetes medications include: metformin 1000mg  BID (supposed to take as two 500mg  tabs BID). However, patient reports taking the medication 1,000 mg in the morning only Current hypertension medications include: amlodipine 5 mg Current hyperlipidemia medications include: atorvastatin 20 mg  Patient denies adherence with medications, reports missing medications two times this past week due to going on a trip and forgetting medication.   Insurance coverage: [place] Medicaid  Patient denies hypoglycemic events.  Patient denies nocturia (nighttime urination).  Patient denies neuropathy (nerve pain). Patient denies visual changes. Patient reports self foot exams.   Patient reported dietary habits: Eats 2-3 meals/day -Continuing to try to limit carbs, starches, and sweets. -Her [name] is helping her track her diet better   Patient-reported exercise habits:  - Endorses 2 days of exercise recently by walking around the house for 30 minutes   O:  Libre3 CGM Download 8/8 (data from 9/11-9/17) % Time CGM is active: 95% Average Glucose: 154 mg/dL Glucose Management Indicator: 7%  Glucose Variability: 26.7% (goal <36%) Time in Goal:  - Time in range 70-180: 78% - Time above range: 20% - Time below range: 0% Observed patterns: trends high  in the morning around 9 am and a smaller spike ~9 pm   Lab Results  Component Value Date   HGBA1C 7.3 (A) 01/21/2023   There were no vitals filed for this visit.  Lipid Panel     Component Value Date/Time   CHOL 145 01/27/2023 0847   TRIG 95 01/27/2023 0847   HDL 57 01/27/2023 0847   CHOLHDL 4.0 11/29/2019 0959   LDLCALC 70 01/27/2023 0847    Clinical Atherosclerotic Cardiovascular Disease (ASCVD): No  The 10-year ASCVD risk score (Arnett DK, et al., 2019) is: 10.5%   Values used to calculate the score:     Age: 58 years     Sex: Female     Is Non-Hispanic African American: No     Diabetic: Yes     Tobacco smoker: No     Systolic Blood Pressure: 129 mmHg     Is BP treated: Yes     HDL Cholesterol: 57 mg/dL     Total Cholesterol: 145 mg/dL   Patient is participating in a Managed Medicaid Plan: No   A/P: Diabetes longstanding, currently close to goal. Last A1c in clinic is a little higher than her GMI. Patient is able to verbalize appropriate hypoglycemia management plan. Medication adherence appears suboptimal due to not taking metformin twice daily and missed doses.  -Educated patient on the importance of adherence to get her diabetes well controlled. -Continue with metformin monotherapy - advised patient to increase to 1,000 mg twice daily  -Refill for medication sent in today -Patient educated on purpose, proper use, and potential adverse effects of metformin.  -Extensively discussed pathophysiology of diabetes, recommended  lifestyle interventions, dietary effects on blood sugar control.  -Counseled on s/sx of and management of hypoglycemia.  -Next A1c anticipated 04/2023.   ASCVD risk - primary prevention in patient with diabetes. Last LDL is 70 (8/14), at goal of <70 mg/dL. ASCVD risk factors include HTN, DM and 10-year ASCVD risk score of 10.5%. moderate intensity statin indicated.  -Continue atorvastatin 20 mg.  -Refill for medication sent in today  Hypertension  longstanding. Blood pressure goal of <130/80 mmHg. Medication adherence okay. -Continue amlodipine 5 mg. -Refill for medication sent in today  Written patient instructions provided. Patient verbalized understanding of treatment plan.  Total time in face to face counseling 30 minutes.    Follow-up:  Pharmacist: - 04/01/2023 for one-month f/u - 05/03/2023 for [name] final PCP clinic visit on 08/23/2023  Roslyn Smiling, PharmD PGY1 Pharmacy Resident 03/02/2023 9:33 AM

## 2023-04-01 ENCOUNTER — Encounter: Payer: Self-pay | Admitting: Pharmacist

## 2023-04-01 ENCOUNTER — Ambulatory Visit: Payer: Self-pay | Attending: Family Medicine | Admitting: Pharmacist

## 2023-04-01 DIAGNOSIS — I10 Essential (primary) hypertension: Secondary | ICD-10-CM | POA: Insufficient documentation

## 2023-04-01 DIAGNOSIS — Z7984 Long term (current) use of oral hypoglycemic drugs: Secondary | ICD-10-CM | POA: Insufficient documentation

## 2023-04-01 DIAGNOSIS — E785 Hyperlipidemia, unspecified: Secondary | ICD-10-CM | POA: Insufficient documentation

## 2023-04-01 DIAGNOSIS — E119 Type 2 diabetes mellitus without complications: Secondary | ICD-10-CM | POA: Insufficient documentation

## 2023-04-01 DIAGNOSIS — Z8619 Personal history of other infectious and parasitic diseases: Secondary | ICD-10-CM | POA: Insufficient documentation

## 2023-04-01 NOTE — Progress Notes (Signed)
S:    62 y.o. female who presents for diabetes evaluation, education, and management. Of note, she is also enrolled in our [name] study. PMH is significant for T2DM, HTN, HLD, hx Hep C. Patient reports Diabetes was diagnosed 11/2019.   Patient was referred and last seen by PCP, Dr. Alvis Lemmings, on 02/22/2023. Last seen by pharmacist on 03/02/2023. WE emphasized adherence with metformin 1000 mg BID.   Today, still reports suboptimal adherence. However, CGM data looks excellent! Summarized below. She is only taking metformin 1 tablet (500mg ) BID. Regarding her DM, she has no complaints today.   Of note, patient endorses two week history of rectal pain when she uses the restroom. Her daughter tells me today that she has a hx of hemorrhoids. Denies any hematochezia or melena. Endorses pain only when using the restroom. She tells me she notices inflammation locally at the anus. Has tried OTC hydrocortisone with minimal relief. No hx of colonoscopy in her chart. She confirms today that she has never had a colonoscopy.   Family/Social History:  Fhx: HTN, stroke, cancer Tobacco: never smoker  Insurance coverage: [place] Medicaid   Current diabetes medications include: metformin 500mg  BID  Current hypertension medications include: amlodipine 5 mg Current hyperlipidemia medications include: atorvastatin 20 mg  Patient reports adherence with metformin but is only taking 500 mg BID.  Insurance coverage:  Medicaid  Patient denies hypoglycemic events.  Patient denies nocturia (nighttime urination).  Patient denies neuropathy (nerve pain). Patient denies visual changes. Patient reports self foot exams.   Patient reported dietary habits: Eats 2-3 meals/day -Continuing to try to limit carbs, starches, and sweets. -Her [name] is helping her track her diet better   Patient-reported exercise habits:  - Endorses 2 days of exercise recently by walking around the house for 30 minutes   O:  Libre3 CGM  Download 8/8 (data from 9/20-10/17/2024) % Time CGM is active: 96% Average Glucose: 125 mg/dL Glucose Management Indicator: 6.3%  Glucose Variability: 29% (goal <36%) Time in Goal:  - Time in range 70-180: 90% - Time above range: 10% - Time below range: 0%  Lab Results  Component Value Date   HGBA1C 7.3 (A) 01/21/2023   There were no vitals filed for this visit.  Lipid Panel     Component Value Date/Time   CHOL 145 01/27/2023 0847   TRIG 95 01/27/2023 0847   HDL 57 01/27/2023 0847   CHOLHDL 4.0 11/29/2019 0959   LDLCALC 70 01/27/2023 0847    Clinical Atherosclerotic Cardiovascular Disease (ASCVD): No  The 10-year ASCVD risk score (Arnett DK, et al., 2019) is: 10.5%   Values used to calculate the score:     Age: 81 years     Sex: Female     Is Non-Hispanic African American: No     Diabetic: Yes     Tobacco smoker: No     Systolic Blood Pressure: 129 mmHg     Is BP treated: Yes     HDL Cholesterol: 57 mg/dL     Total Cholesterol: 145 mg/dL   Patient is participating in a Managed Medicaid Plan: No   A/P: Diabetes longstanding, currently close to goal. CGM report shows goal control since last visit. Commended her for this. No hypoglycemia noted but patient is able to verbalize appropriate hypoglycemia management plan. We can keep her on metformin 500 mg BID for now. -Given two sensors from sample stock.  -Educated patient on the importance of adherence to get her diabetes well controlled. -Continue  with metformin monotherapy with 500 mg BID for now.   -Refill for medication sent in today -Patient educated on purpose, proper use, and potential adverse effects of metformin.  -Extensively discussed pathophysiology of diabetes, recommended lifestyle interventions, dietary effects on blood sugar control.  -Counseled on s/sx of and management of hypoglycemia.  -Next A1c anticipated 04/2023.  -Pt directed to make walk-in appointment or seek the mobile unit for evaluation of  other complaint today.  Written patient instructions provided. Patient verbalized understanding of treatment plan.  Total time in face to face counseling 30 minutes.    Follow-up:  Pharmacist: - 05/03/2023 for Rayford Halsted final PCP clinic visit on 08/23/2023  Butch Penny, PharmD, BCACP, CPP Clinical Pharmacist [facility] [phone]

## 2023-04-19 ENCOUNTER — Encounter: Payer: Self-pay | Admitting: Internal Medicine

## 2023-04-19 ENCOUNTER — Ambulatory Visit: Payer: Self-pay | Attending: Internal Medicine | Admitting: Internal Medicine

## 2023-04-19 VITALS — BP 123/74 | HR 63 | Wt 127.2 lb

## 2023-04-19 DIAGNOSIS — Z1211 Encounter for screening for malignant neoplasm of colon: Secondary | ICD-10-CM

## 2023-04-19 DIAGNOSIS — E119 Type 2 diabetes mellitus without complications: Secondary | ICD-10-CM | POA: Insufficient documentation

## 2023-04-19 DIAGNOSIS — K649 Unspecified hemorrhoids: Secondary | ICD-10-CM | POA: Insufficient documentation

## 2023-04-19 DIAGNOSIS — I10 Essential (primary) hypertension: Secondary | ICD-10-CM | POA: Insufficient documentation

## 2023-04-19 DIAGNOSIS — S3669XA Other injury of rectum, initial encounter: Secondary | ICD-10-CM

## 2023-04-19 MED ORDER — DILTIAZEM GEL 2 %: CUTANEOUS | 0 refills | Status: DC

## 2023-04-19 NOTE — Progress Notes (Signed)
Patient ID: Deborah Lyons, female    DOB: 30-Mar-1961  MRN: [id number]  CC: Hemorrhoids   Subjective: Deborah Lyons is a 62 y.o. female who presents for UC.  PCP is Dr. Alvis Lemmings Her concerns today include:  Pt with hx of DM, HTN  AMN Language interpreter used during this encounter. #[id number], Jaime  C/o pain around the rectum when she has BM x 1 mth.  She does not feel any mass/lumps around the rectum but endorses hx of hemorrhoids. No blood in stools BM have been soft   Patient Active Problem List   Diagnosis Date Noted   History of hepatitis C 10/23/2022   Type 2 diabetes mellitus without complication, without long-term current use of insulin (HCC) 12/13/2019   Essential hypertension 12/13/2019   Hyperlipidemia 12/13/2019     Current Outpatient Medications on File Prior to Visit  Medication Sig Dispense Refill   amLODipine (NORVASC) 5 MG tablet Take 1 tablet (5 mg total) by mouth daily. 90 tablet 1   atorvastatin (LIPITOR) 20 MG tablet Take 1 tablet (20 mg total) by mouth daily. 90 tablet 1   metFORMIN (GLUCOPHAGE) 500 MG tablet Take 2 tablets (1,000 mg total) by mouth 2 (two) times daily with a meal. 360 tablet 1   Blood Pressure Monitoring (BLOOD PRESSURE CUFF) MISC Take blood pressure 3-4 times weekly and write down numbers in a notebook to bring to primary care doctor appointment. (Patient not taking: Reported on 04/19/2023) 1 each 0   No current facility-administered medications on file prior to visit.    No Known Allergies  Social History   Socioeconomic History   Marital status: Married    Spouse name: Not on file   Number of children: 4   Years of education: Not on file   Highest education level: 3rd grade  Occupational History   Not on file  Tobacco Use   Smoking status: Never   Smokeless tobacco: Never  Vaping Use   Vaping status: Never Used  Substance and Sexual Activity   Alcohol use: No   Drug use: No   Sexual activity: Not Currently     Birth control/protection: Post-menopausal  Other Topics Concern   Not on file  Social History Narrative   Not on file   Social Determinants of Health   Financial Resource Strain: Medium Risk (04/19/2023)   Overall Financial Resource Strain (CARDIA)    Difficulty of Paying Living Expenses: Somewhat hard  Food Insecurity: No Food Insecurity (04/19/2023)   Hunger Vital Sign    Worried About Running Out of Food in the Last Year: Never true    Ran Out of Food in the Last Year: Never true  Transportation Needs: No Transportation Needs (04/19/2023)   PRAPARE - Administrator, Civil Service (Medical): No    Lack of Transportation (Non-Medical): No  Physical Activity: Inactive (04/19/2023)   Exercise Vital Sign    Days of Exercise per Week: 0 days    Minutes of Exercise per Session: 0 min  Stress: No Stress Concern Present (04/19/2023)   [employer] of Occupational Health - Occupational Stress Questionnaire    Feeling of Stress : Not at all  Social Connections: Moderately Integrated (04/19/2023)   Social Connection and Isolation Panel [NHANES]    Frequency of Communication with Friends and Family: Twice a week    Frequency of Social Gatherings with Friends and Family: Once a week    Attends Religious Services: More than 4  times per year    Active Member of Clubs or Organizations: Yes    Attends Banker Meetings: Never    Marital Status: Separated  Intimate Partner Violence: Not At Risk (07/13/2017)   Humiliation, Afraid, Rape, and Kick questionnaire    Fear of Current or Ex-Partner: No    Emotionally Abused: No    Physically Abused: No    Sexually Abused: No    Family History  Problem Relation Age of Onset   Hypertension Mother    Breast cancer Sister    Stroke Brother     Past Surgical History:  Procedure Laterality Date   arm surgery Left 06/2019   surgery to remove fat tissue (lump) done in [place]   CESAREAN SECTION      ROS: Review of  Systems Negative except as stated above  PHYSICAL EXAM: BP 123/74 (BP Location: Right Arm, Patient Position: Sitting, Cuff Size: Normal)   Pulse 63   Wt 127 lb 3.2 oz (57.7 kg)   SpO2 98%   BMI 24.84 kg/m   Physical Exam  General appearance - alert, well appearing, and in no distress Mental status - normal mood, behavior, speech, dress, motor activity, and thought processes Rectal - CMA Carly present: No external hemorrhoids noted.  No prolapsing hemorrhoids seen when patient was asked to bear down.  She does have small tear in the rectum at the 6 o'clock position      Latest Ref Rng & Units 01/27/2023    8:47 AM 10/20/2022    9:45 AM 12/12/2019   12:07 PM  CMP  Glucose 70 - 99 mg/dL 161  096  045   BUN 8 - 27 mg/dL 9  10  12    Creatinine 0.57 - 1.00 mg/dL 4.09  8.11  9.14   Sodium 134 - 144 mmol/L 140  138  138   Potassium 3.5 - 5.2 mmol/L 4.4  4.2  4.3   Chloride 96 - 106 mmol/L 102  100  101   CO2 20 - 29 mmol/L 25  22  23    Calcium 8.7 - 10.3 mg/dL 9.5  9.4  9.5   Total Protein 6.0 - 8.5 g/dL 7.3  7.2    Total Bilirubin 0.0 - 1.2 mg/dL 0.4  0.4    Alkaline Phos 44 - 121 IU/L 82  78    AST 0 - 40 IU/L 19  17    ALT 0 - 32 IU/L 48  34     Lipid Panel     Component Value Date/Time   CHOL 145 01/27/2023 0847   TRIG 95 01/27/2023 0847   HDL 57 01/27/2023 0847   CHOLHDL 4.0 11/29/2019 0959   LDLCALC 70 01/27/2023 0847    CBC    Component Value Date/Time   WBC 5.0 02/14/2015 1305   RBC 4.60 02/14/2015 1305   HGB 12.7 02/14/2015 1305   HCT 38.8 02/14/2015 1305   PLT 244 02/14/2015 1305   MCV 84.3 02/14/2015 1305   MCH 27.6 02/14/2015 1305   MCHC 32.7 02/14/2015 1305   RDW 13.1 02/14/2015 1305   LYMPHSABS 2.2 02/14/2015 1305   MONOABS 0.5 02/14/2015 1305   EOSABS 0.2 02/14/2015 1305   BASOSABS 0.0 02/14/2015 1305    ASSESSMENT AND PLAN: 1. Rectal tear Discussed the importance of keeping bowel movements soft and regular.  Recommend MiraLAX over-the-counter as  needed for any constipation. - diltiazem 2 % GEL; Apply small amount to rectal area BID x 5 days  Dispense: 30 g; Refill: 0  2. Screening for colon cancer - Ambulatory referral to Gastroenterology   Patient was given the opportunity to ask questions.  Patient verbalized understanding of the plan and was able to repeat key elements of the plan.   This documentation was completed using Paediatric nurse.  Any transcriptional errors are unintentional.  No orders of the defined types were placed in this encounter.    Requested Prescriptions    No prescriptions requested or ordered in this encounter    No follow-ups on file.  Jonah Blue, MD, FACP

## 2023-04-19 NOTE — Progress Notes (Signed)
A1c

## 2023-04-20 ENCOUNTER — Other Ambulatory Visit: Payer: Self-pay | Admitting: Family Medicine

## 2023-04-20 DIAGNOSIS — S3669XA Other injury of rectum, initial encounter: Secondary | ICD-10-CM

## 2023-04-20 NOTE — Telephone Encounter (Signed)
Medication Refill - Medication: diltiazem 2 % GEL   Has the patient contacted their pharmacy? Yes  ((Agent: If yes, when and what did the pharmacy advise?)pharmacy called in with pt there  Preferred Pharmacy (with phone number or street name):  [facility] - [place], [place] - [address] Phone: [phone]  Fax: [phone]     Has the patient been seen for an appointment in the last year OR does the patient have an upcoming appointment? yes  Agent: Please be advised that RX refills may take up to 3 business days. We ask that you follow-up with your pharmacy.

## 2023-04-21 ENCOUNTER — Other Ambulatory Visit: Payer: Self-pay | Admitting: Internal Medicine

## 2023-04-21 ENCOUNTER — Telehealth: Payer: Self-pay | Admitting: Pharmacist

## 2023-04-21 ENCOUNTER — Ambulatory Visit: Payer: Self-pay

## 2023-04-21 DIAGNOSIS — S3669XA Other injury of rectum, initial encounter: Secondary | ICD-10-CM

## 2023-04-21 MED ORDER — DILTIAZEM GEL 2 %: CUTANEOUS | 0 refills | Status: DC

## 2023-04-21 NOTE — Telephone Encounter (Signed)
I sent this when she was in clinic earlier this week. I tried to resend just now and both times it printed. Are you able to try and send electronically, or we could print and fax to the [facility] on [address] (fax # [phone]).

## 2023-04-21 NOTE — Telephone Encounter (Signed)
Requested medication (s) are due for refill today: routing for review  Requested medication (s) are on the active medication list: yes  Last refill:  04/19/23  Future visit scheduled: yes  Notes to clinic:   Medication not assigned to a protocol, review manually.     Requested Prescriptions  Pending Prescriptions Disp Refills   diltiazem 2 % GEL 30 g 0    Sig: Apply small amount to rectal area BID x 5 days     Off-Protocol Failed - 04/20/2023  4:55 PM      Failed - Medication not assigned to a protocol, review manually.      Passed - Valid encounter within last 12 months    Recent Outpatient Visits           2 days ago Rectal tear   [facility] [place]. [facility] Jonah Blue B, MD   2 weeks ago Type 2 diabetes mellitus without complication, without long-term current use of insulin (HCC)   [facility] [place]. [facility] Lois Huxley, Fallis L, RPH-CPP   1 month ago Type 2 diabetes mellitus without complication, without long-term current use of insulin (HCC)   [facility] [facility] Of [place]. [facility] Drucilla Chalet, RPH-CPP   1 month ago Annual physical exam   [facility] Of [place]. [facility] Hoy Register, MD   3 months ago Essential hypertension   [facility] Of [place]. [facility] Hoy Register, MD       Future Appointments             In 1 week Lois Huxley, Cornelius Moras, RPH-CPP  [facility] Of [place]. [facility]   In 4 months Hoy Register, MD [facility] A Dept Of [place]. [facility]

## 2023-04-21 NOTE — Telephone Encounter (Signed)
I tried to send it as e-script and it still prints.  Please let pt know that she would have to come and pick up the prescription for the rectal cream and take it to [facility] on [address] (fax # [phone]).Marland Kitchen

## 2023-04-21 NOTE — Telephone Encounter (Signed)
Chief Complaint: Medication order  Disposition: [] ED /[] Urgent Care (no appt availability in office) / [] Appointment(In office/virtual)/ []  [place]/ [x] Home Care/ [] Refused Recommended Disposition /[] [place]/ []  Follow-up with PCP Additional Notes: Spoke to [place] who stated she spoke with the office yesterday and was told they would send Rx to the pharmacy but she never received it. Checked patient's chart and Rx was ordered on 04/19/23 but was printed instead of being sent electronically. Verbal order given over the phone reading the prescription.    Reason for Disposition  [1] Prescription prescribed recently is not at pharmacy AND [2] triager has access to patient's EMR AND [3] prescription is recorded in the EMR  Answer Assessment - Initial Assessment Questions 1. DRUG NAME: "What medicine do you need to have refilled?"     diltiazem 2 % GEL 2. REFILLS REMAINING: "How many refills are remaining?" (Note: The label on the medicine or pill bottle will show how many refills are remaining. If there are no refills remaining, then a renewal may be needed.)     None 4. PRESCRIBING HCP: "Who prescribed it?" Reason: If prescribed by specialist, call should be referred to that group.     Dr. Laural Benes 5. SYMPTOMS: "Do you have any symptoms?"     N/A  Protocols used: Medication Refill and Renewal Call-A-AH

## 2023-04-26 NOTE — Telephone Encounter (Signed)
Prescription successfully faxed to [facility] on 04/26/2023.

## 2023-05-02 NOTE — Progress Notes (Unsigned)
S:    Deborah Lyons # [phone] 62 y.o. female who presents for diabetes evaluation, education, and management in the context of the [name] study. This is her 3rd and final [name] visit. PMH is significant for T2DM, HTN, HLD, hx Hep C. Patient reports Diabetes was diagnosed 11/2019.   Patient was referred and last seen by PCP, Dr. Alvis Lemmings, on 02/22/2023. Last seen by pharmacist on 04/01/2023. Emphasized importance of adherence with metformin, and had patient continue taking metformin 500mg  BID.  Today, patient presents with her daughter. Reports adherence with metformin 500mg  BID. Has missed PM dose maybe 1 or 2 times over the past couple weeks. No GI upset or diarrhea. Of note, patient's daughter is requesting to be seen by MD for hemorrhoids.  Family/Social History:  Fhx: HTN, stroke, cancer Tobacco: never smoker  Insurance coverage: [place] [employer] Family Planning  Current diabetes medications include: metformin 500mg  BID  Current hypertension medications include: amlodipine 5 mg Current hyperlipidemia medications include: atorvastatin 20 mg  Insurance coverage: [place] Medicaid  Patient denies hypoglycemic events. I noted some lows overnight (9p-6a) for one evening on [place] report with values of 53-69, but patient denies having any symptoms at that time. Patient only made aware of lows due to daughter mentioning the beeping.  Patient denies nocturia (nighttime urination).  Patient denies neuropathy (nerve pain). Patient denies visual changes. Patient reports self foot exams.   Patient reported dietary habits: Eats 2-3 meals/day -Breakfast: 2 cookies, little bit of milk, yogurt -Continuing to try to limit carbs, starches, and sweets. -Her [name] is helping her track her diet better   Patient-reported exercise habits:  - Endorses 2 days of exercise recently by walking around the house for 30 minutes   O:  Libre3 CGM Download (data from 10/22 - 11/18) % Time CGM is active:  98 Average Glucose: 133 mg/dL Glucose Management Indicator: 6.5%  Glucose Variability: 27.9% (goal <36%) Time in Goal:  - Time in range 70-180: 88% - Time above range: 12% - Time below range: 0%  Libre3 CGM Download 8/8 (data from 9/20-10/17/2024) % Time CGM is active: 96% Average Glucose: 125 mg/dL Glucose Management Indicator: 6.3%  Glucose Variability: 29% (goal <36%) Time in Goal:  - Time in range 70-180: 90% - Time above range: 10% - Time below range: 0%  Lab Results  Component Value Date   HGBA1C 6.9 05/03/2023   There were no vitals filed for this visit.  Lipid Panel     Component Value Date/Time   CHOL 145 01/27/2023 0847   TRIG 95 01/27/2023 0847   HDL 57 01/27/2023 0847   CHOLHDL 4.0 11/29/2019 0959   LDLCALC 70 01/27/2023 0847    Clinical Atherosclerotic Cardiovascular Disease (ASCVD): No  The 10-year ASCVD risk score (Arnett DK, et al., 2019) is: 9.6%   Values used to calculate the score:     Age: 73 years     Sex: Female     Is Non-Hispanic African American: No     Diabetic: Yes     Tobacco smoker: No     Systolic Blood Pressure: 123 mmHg     Is BP treated: Yes     HDL Cholesterol: 57 mg/dL     Total Cholesterol: 145 mg/dL   A/P: Diabetes longstanding, currently controlled based on A1c today of 6.9%, which has decreased from previous A1c of 7.3% (01/2023), and CGM report. Hypoglycemia episode noted but no symptoms reported. Patient is able to verbalize appropriate hypoglycemia management plan. To help  with adherence and decrease the risk of overnight lows, will transition metformin to XR. Patient and daughter are agreeable, but would like to finish off her home supply of the IR formulation first. Informed patient and daughter that patient has completed [name] study, so we will no longer be providing free [place] sensors. Noted that current Medicaid plan will likely not cover sensors, but they can be purchased at cash price of $80/month. Patient and  daughter voiced understanding. -Finish off current supply of metformin IR 500mg  BID, then change metformin to 1000mg  (2 tablets) XR once daily -Extensively discussed pathophysiology of diabetes, recommended lifestyle interventions, dietary effects on blood sugar control.  -Counseled on s/sx of and management of hypoglycemia.  -Next A1c anticipated 07/2023  Advised daughter to schedule appointment with MD or check availability for walk-in appointments at check out.  Written patient instructions provided. Patient verbalized understanding of treatment plan.  Total time in face to face counseling 30 minutes.    Follow-up:  Pharmacist: 3 months (08/02/23) PCP clinic visit on 08/23/2023  Nicole Kindred, PharmD PGY1 Pharmacy Resident 05/03/2023 9:43 AM

## 2023-05-03 ENCOUNTER — Encounter: Payer: Self-pay | Admitting: Pharmacist

## 2023-05-03 ENCOUNTER — Other Ambulatory Visit: Payer: Self-pay

## 2023-05-03 ENCOUNTER — Ambulatory Visit: Payer: Medicaid Other | Attending: Family Medicine | Admitting: Pharmacist

## 2023-05-03 DIAGNOSIS — Z7984 Long term (current) use of oral hypoglycemic drugs: Secondary | ICD-10-CM

## 2023-05-03 DIAGNOSIS — E119 Type 2 diabetes mellitus without complications: Secondary | ICD-10-CM

## 2023-05-03 LAB — POCT GLYCOSYLATED HEMOGLOBIN (HGB A1C): HbA1c, POC (controlled diabetic range): 6.9 % (ref 0.0–7.0)

## 2023-05-03 MED ORDER — METFORMIN HCL ER 500 MG PO TB24
1000.0000 mg | ORAL_TABLET | Freq: Every day | ORAL | 1 refills | Status: DC
  Filled 2023-05-03: qty 180, 90d supply, fill #0

## 2023-05-17 ENCOUNTER — Ambulatory Visit: Payer: Self-pay | Attending: Family Medicine | Admitting: Family Medicine

## 2023-05-17 ENCOUNTER — Encounter: Payer: Self-pay | Admitting: Family Medicine

## 2023-05-17 VITALS — BP 128/70 | HR 63 | Ht 60.0 in | Wt 129.2 lb

## 2023-05-17 DIAGNOSIS — K649 Unspecified hemorrhoids: Secondary | ICD-10-CM | POA: Insufficient documentation

## 2023-05-17 DIAGNOSIS — Z79899 Other long term (current) drug therapy: Secondary | ICD-10-CM | POA: Insufficient documentation

## 2023-05-17 DIAGNOSIS — E785 Hyperlipidemia, unspecified: Secondary | ICD-10-CM | POA: Insufficient documentation

## 2023-05-17 DIAGNOSIS — E119 Type 2 diabetes mellitus without complications: Secondary | ICD-10-CM | POA: Insufficient documentation

## 2023-05-17 DIAGNOSIS — Z713 Dietary counseling and surveillance: Secondary | ICD-10-CM | POA: Insufficient documentation

## 2023-05-17 DIAGNOSIS — Z7984 Long term (current) use of oral hypoglycemic drugs: Secondary | ICD-10-CM

## 2023-05-17 DIAGNOSIS — Z5986 Financial insecurity: Secondary | ICD-10-CM

## 2023-05-17 DIAGNOSIS — Z7182 Exercise counseling: Secondary | ICD-10-CM | POA: Insufficient documentation

## 2023-05-17 DIAGNOSIS — I10 Essential (primary) hypertension: Secondary | ICD-10-CM | POA: Insufficient documentation

## 2023-05-17 DIAGNOSIS — Z1211 Encounter for screening for malignant neoplasm of colon: Secondary | ICD-10-CM

## 2023-05-17 MED ORDER — ATORVASTATIN CALCIUM 20 MG PO TABS
20.0000 mg | ORAL_TABLET | Freq: Every day | ORAL | 1 refills | Status: DC
  Filled 2023-08-31: qty 90, 30d supply, fill #0

## 2023-05-17 MED ORDER — HYDROCORT-PRAMOXINE (PERIANAL) 2.5-1 % EX CREA
1.0000 | TOPICAL_CREAM | Freq: Three times a day (TID) | CUTANEOUS | 3 refills | Status: DC
  Filled 2023-06-18: qty 30, 10d supply, fill #0

## 2023-05-17 MED ORDER — AMLODIPINE BESYLATE 5 MG PO TABS: 5.0000 mg | ORAL_TABLET | Freq: Every day | ORAL | 1 refills | Status: DC

## 2023-05-17 MED ORDER — METFORMIN HCL ER 500 MG PO TB24: 1000.0000 mg | ORAL_TABLET | Freq: Every day | ORAL | 1 refills | Status: DC

## 2023-05-17 NOTE — Progress Notes (Signed)
Subjective:  Patient ID: Deborah Lyons, female    DOB: 04-Feb-1961  Age: 62 y.o. MRN: [id number]  CC: Hemorrhoids   HPI Deborah Lyons is a 62 y.o. year old female with a history of Type 2 DM (A1c 6.9), HTN, Hyperlipidemia.   Interval History: Discussed the use of AI scribe software for clinical note transcription with the patient, who gave verbal consent to proceed.  She presents with hemorrhoids. She denies constipation and reports regular bowel movements. She has been using a cream  for the hemorrhoids, which was prescribed by a previous doctor. The cream has helped to alleviate the pain associated with the hemorrhoids, but a small, pimple-like growth remains at the site.   With regard to her mellitus her A1c is 6.9 and she is adherent with metformin.  Denies presence of hypoglycemia or numbness in extremities.  Currently not up-to-date on annual eye exam. She is also adherent with her statin and antihypertensive.      Past Medical History:  Diagnosis Date   Diabetes mellitus without complication (HCC)    Hypertension     Past Surgical History:  Procedure Laterality Date   arm surgery Left 06/2019   surgery to remove fat tissue (lump) done in [place]   CESAREAN SECTION      Family History  Problem Relation Age of Onset   Hypertension Mother    Breast cancer Sister    Stroke Brother     Social History   Socioeconomic History   Marital status: Married    Spouse name: Not on file   Number of children: 4   Years of education: Not on file   Highest education level: 3rd grade  Occupational History   Not on file  Tobacco Use   Smoking status: Never   Smokeless tobacco: Never  Vaping Use   Vaping status: Never Used  Substance and Sexual Activity   Alcohol use: No   Drug use: No   Sexual activity: Not Currently    Birth control/protection: Post-menopausal  Other Topics Concern   Not on file  Social History Narrative   Not on file   Social  Determinants of Health   Financial Resource Strain: Medium Risk (04/19/2023)   Overall Financial Resource Strain (CARDIA)    Difficulty of Paying Living Expenses: Somewhat hard  Food Insecurity: No Food Insecurity (04/19/2023)   Hunger Vital Sign    Worried About Running Out of Food in the Last Year: Never true    Ran Out of Food in the Last Year: Never true  Transportation Needs: No Transportation Needs (04/19/2023)   PRAPARE - Administrator, Civil Service (Medical): No    Lack of Transportation (Non-Medical): No  Physical Activity: Inactive (04/19/2023)   Exercise Vital Sign    Days of Exercise per Week: 0 days    Minutes of Exercise per Session: 0 min  Stress: No Stress Concern Present (04/19/2023)   [employer] of Occupational Health - Occupational Stress Questionnaire    Feeling of Stress : Not at all  Social Connections: Moderately Integrated (04/19/2023)   Social Connection and Isolation Panel [NHANES]    Frequency of Communication with Friends and Family: Twice a week    Frequency of Social Gatherings with Friends and Family: Once a week    Attends Religious Services: More than 4 times per year    Active Member of [employer] or Organizations: Yes    Attends Banker Meetings: Never  Marital Status: Separated    No Known Allergies  Outpatient Medications Prior to Visit  Medication Sig Dispense Refill   diltiazem 2 % GEL Apply small amount to rectal area BID x 5 days 30 g 0   amLODipine (NORVASC) 5 MG tablet Take 1 tablet (5 mg total) by mouth daily. 90 tablet 1   atorvastatin (LIPITOR) 20 MG tablet Take 1 tablet (20 mg total) by mouth daily. 90 tablet 1   metFORMIN (GLUCOPHAGE-XR) 500 MG 24 hr tablet Take 2 tablets (1,000 mg total) by mouth daily with breakfast. 180 tablet 1   Blood Pressure Monitoring (BLOOD PRESSURE CUFF) MISC Take blood pressure 3-4 times weekly and write down numbers in a notebook to bring to primary care doctor appointment.  (Patient not taking: Reported on 04/19/2023) 1 each 0   No facility-administered medications prior to visit.     ROS Review of Systems  Constitutional:  Negative for activity change and appetite change.  HENT:  Negative for sinus pressure and sore throat.   Respiratory:  Negative for chest tightness, shortness of breath and wheezing.   Cardiovascular:  Negative for chest pain and palpitations.  Gastrointestinal:  Negative for abdominal distention, abdominal pain and constipation.  Genitourinary: Negative.   Musculoskeletal: Negative.   Psychiatric/Behavioral:  Negative for behavioral problems and dysphoric mood.     Objective:  BP 128/70   Pulse 63   Ht 5' (1.524 m)   Wt 129 lb 3.2 oz (58.6 kg)   SpO2 98%   BMI 25.23 kg/m      05/17/2023    9:26 AM 04/19/2023   11:17 AM 02/22/2023    9:40 AM  BP/Weight  Systolic BP 128 123 129  Diastolic BP 70 74 85  Wt. (Lbs) 129.2 127.2   BMI 25.23 kg/m2 24.84 kg/m2       Physical Exam Constitutional:      Appearance: She is well-developed.  Cardiovascular:     Rate and Rhythm: Normal rate.     Heart sounds: Normal heart sounds. No murmur heard. Pulmonary:     Effort: Pulmonary effort is normal.     Breath sounds: Normal breath sounds. No wheezing or rales.  Chest:     Chest wall: No tenderness.  Abdominal:     General: Bowel sounds are normal. There is no distension.     Palpations: Abdomen is soft. There is no mass.     Tenderness: There is no abdominal tenderness.  Musculoskeletal:        General: Normal range of motion.     Right lower leg: No edema.     Left lower leg: No edema.  Neurological:     Mental Status: She is alert and oriented to person, place, and time.  Psychiatric:        Mood and Affect: Mood normal.        Latest Ref Rng & Units 01/27/2023    8:47 AM 10/20/2022    9:45 AM 12/12/2019   12:07 PM  CMP  Glucose 70 - 99 mg/dL 696  295  284   BUN 8 - 27 mg/dL 9  10  12    Creatinine 0.57 - 1.00 mg/dL  1.32  4.40  1.02   Sodium 134 - 144 mmol/L 140  138  138   Potassium 3.5 - 5.2 mmol/L 4.4  4.2  4.3   Chloride 96 - 106 mmol/L 102  100  101   CO2 20 - 29 mmol/L 25  22  23   Calcium 8.7 - 10.3 mg/dL 9.5  9.4  9.5   Total Protein 6.0 - 8.5 g/dL 7.3  7.2    Total Bilirubin 0.0 - 1.2 mg/dL 0.4  0.4    Alkaline Phos 44 - 121 IU/L 82  78    AST 0 - 40 IU/L 19  17    ALT 0 - 32 IU/L 48  34      Lipid Panel     Component Value Date/Time   CHOL 145 01/27/2023 0847   TRIG 95 01/27/2023 0847   HDL 57 01/27/2023 0847   CHOLHDL 4.0 11/29/2019 0959   LDLCALC 70 01/27/2023 0847    CBC    Component Value Date/Time   WBC 5.0 02/14/2015 1305   RBC 4.60 02/14/2015 1305   HGB 12.7 02/14/2015 1305   HCT 38.8 02/14/2015 1305   PLT 244 02/14/2015 1305   MCV 84.3 02/14/2015 1305   MCH 27.6 02/14/2015 1305   MCHC 32.7 02/14/2015 1305   RDW 13.1 02/14/2015 1305   LYMPHSABS 2.2 02/14/2015 1305   MONOABS 0.5 02/14/2015 1305   EOSABS 0.2 02/14/2015 1305   BASOSABS 0.0 02/14/2015 1305    Lab Results  Component Value Date   HGBA1C 6.9 05/03/2023    Assessment & Plan:      Hemorrhoids No associated constipation. Using over-the-counter cream with some relief. Discussed surgical intervention as an option, but patient is hesitant due to concerns about cost and recovery. -Continue current treatment with over-the-counter cream, sitz bath -Discuss surgical intervention with family and inform the team of decision.  Hypertension Well controlled on current medication. -Continue Amlodipine. -Counseled on blood pressure goal of less than 130/80, low-sodium, DASH diet, medication compliance, 150 minutes of moderate intensity exercise per week. Discussed medication compliance, adverse effects.   Diabetes Mellitus Well controlled with A1c of 6.9. -Continue Metformin. -Counseled on Diabetic diet, my plate method, 295 minutes of moderate intensity exercise/week Blood sugar logs with fasting goals  of 80-120 mg/dl, random of less than 621 and in the event of sugars less than 60 mg/dl or greater than 308 mg/dl encouraged to notify the clinic. Advised on the need for annual eye exams, annual foot exams, Pneumonia vaccine.   Hyperlipidemia Well controlled on current medication. -Continue Atorvastatin.  General Health Maintenance -Refer for annual diabetic eye exam. -Schedule follow-up appointment in six months.          Meds ordered this encounter  Medications   hydrocortisone-pramoxine (ANALPRAM HC) 2.5-1 % rectal cream    Sig: Place 1 Application rectally 3 (three) times daily.    Dispense:  30 g    Refill:  3   amLODipine (NORVASC) 5 MG tablet    Sig: Take 1 tablet (5 mg total) by mouth daily.    Dispense:  90 tablet    Refill:  1   atorvastatin (LIPITOR) 20 MG tablet    Sig: Take 1 tablet (20 mg total) by mouth daily.    Dispense:  90 tablet    Refill:  1   metFORMIN (GLUCOPHAGE-XR) 500 MG 24 hr tablet    Sig: Take 2 tablets (1,000 mg total) by mouth daily with breakfast.    Dispense:  180 tablet    Refill:  1    Follow-up: Return in about 6 months (around 11/15/2023) for Chronic medical conditions.       Hoy Register, MD, FAAFP. [facility] [place], [place] [phone]   05/17/2023, 9:49 AM

## 2023-05-17 NOTE — Patient Instructions (Signed)
Hemorroides Hemorrhoids Las hemorroides son venas hinchadas en el recto o la zona que lo rodea, o en la abertura de las nalgas (ano). Hay dos tipos de hemorroides: Internas. Se forman en las venas del interior del recto. Pueden abultarse hacia afuera, irritarse y doler. Externas. Estas se producen en las venas que estn fuera del ano. Pueden sentirse como una hinchazn o un bulto duro dolorosos cerca del ano. La mayora de las hemorroides no causan problemas graves. A menudo, pueden tratarse en casa con cambios en la dieta y el estilo de vida. Si los tratamientos caseros no ayudan, quizs necesite un procedimiento para reducir o extirpar las hemorroides. Cules son las causas? Las hemorroides son causadas por la presin cerca del ano. Esta presin puede deberse a lo siguiente: Estreimiento o diarrea. Hace esfuerzo para eliminar heces. Embarazo. Obesidad. Estar sentado o andar en bicicleta durante mucho tiempo. Levantar objetos pesados u otras cosas que impliquen esfuerzo. Sexo anal. Cules son los signos o sntomas? Los sntomas de esta afeccin incluyen: Engineer, mining. Picazn o irritacin anal. Sangrado que proviene del recto. Fuga de materia fecal (heces). Hinchazn del ano. Uno o ms bultos alrededor del ano. Cmo se diagnostica? Las hemorroides se diagnostican frecuentemente a travs de un examen visual. Posiblemente le realicen otros tipos de pruebas o estudios, como los siguientes: Psychiatrist. Esto lo realiza el mdico mediante tacto rectal con un dedo enguantado. Anoscopia. Este es un examen del ano que se hace con un tubo pequeo. Anlisis de sangre si ha perdido [employer]. Sigmoidoscopa o colonoscopa. Estos son estudios para observar el interior del colon a travs de un tubo que tiene una cmara en el extremo. Cmo se trata? En la [employer], las hemorroides se pueden tratar en casa con cambios en la dieta y el estilo de vida. Si estos cambios no  resultan eficaces, tal vez deba someterse a un procedimiento. Estos procedimientos pueden reducir las hemorroides o extirparlas completamente. Algunos de los procedimientos ms frecuentes son los siguientes: Ligadura con Curator. Las bandas elsticas se colocan en la base de las hemorroides para interrumpir su irrigacin de [place]. Escleroterapia. Se pone un medicamento dentro de las hemorroides para reducir su tamao. Coagulacin con luz infrarroja. Se utiliza un tipo de energa lumnica para eliminar las hemorroides. Hemorroidectoma. Las hemorroides se extirpan durante la [place]. Luego, las venas que las irrigan se atan para Geologist, engineering. Hemorroidopexia con grapas. La base de las hemorroides se engrapa a la pared del recto. Siga estas instrucciones en su casa: Medicamentos Use los medicamentos de venta libre y los recetados solamente como se lo haya indicado el mdico. Use cremas medicadas o medicamentos medicinales que se ponen en el recto (supositorios) como se lo haya indicado el mdico. Comida y bebida  Consumir alimentos ricos en fibra, como frijoles, cereales integrales, y frutas y verduras frescas. Pregntele a su mdico acerca de tomar productos con fibra aadida (suplementos de [place]). Disminuya la cantidad de grasa de la dieta. Esto se puede lograr consumiendo productos lcteos con bajo contenido de grasas, ingiriendo menor cantidad de carnes rojas y evitando los alimentos procesados. Beber suficiente lquido para Radio producer pis (orina) de color amarillo plido. Control del dolor y la hinchazn  Tome baos de asiento tibios durante 20 minutos, 3 a 4 veces por Futures trader. Esto puede ayudar a Glass blower/designer y Environmental health practitioner. Puede hacer esto en una baera o usar un dispositivo porttil para bao de asiento que se coloca sobre el inodoro. Si se  lo indican, aplique hielo en la zona afectada. El uso de compresas de hielo entre baos de asiento puede ser de [place]. Ponga el hielo en una  bolsa plstica. Coloque una toalla entre la piel y Copy. Aplique el hielo durante 20 minutos, 2 a 3 veces por da. Si la piel se le pone de color rojo brillante, retire el hielo de inmediato para evitar daos en la piel. [place] de dao es mayor si no puede sentir dolor, Airline pilot o fro. Instrucciones generales Actividad fsica. Consulte al mdico qu cantidad y qu tipo de ejercicio es mejor para usted. En general, debe realizar al menos 30 minutos de ejercicio moderado la [employer] de la semana (150 minutos cada semana). Es recomendable que pruebe con caminar, andar en bicicleta o practicar yoga. Vaya al bao cuando sienta ganas de defecar. No espere. Evite hacer esfuerzos para defecar. Mantenga el ano limpio y seco. Use papel higinico hmedo o toallitas humedecidas despus de defecar. No pase mucho tiempo sentado en el inodoro. Esto puede aumentar la afluencia de sangre y Chief Technology Officer. Dnde buscar ms informacin [employer] Mills of Diabetes and Digestive and Kidney Diseases [employer] de la Diabetes y las Enfermedades Digestivas y Renales): [employer]Sync.si Comunquese con un mdico si: Tiene ms dolor e hinchazn que no mejoran con Scientist, research (medical). Tiene dificultad para defecar o no puede hacerlo. Siente dolor o tiene inflamacin fuera de la zona de las hemorroides. Solicite ayuda de inmediato si: Tiene sangrado del recto y no puede detenerlo. Esta informacin no tiene Theme park manager el consejo del mdico. Asegrese de hacerle al mdico cualquier pregunta que tenga. Document Revised: 03/19/2022 Document Reviewed: 03/19/2022 Elsevier Patient Education  2024 [employer].

## 2023-06-11 ENCOUNTER — Other Ambulatory Visit: Payer: Self-pay

## 2023-06-14 ENCOUNTER — Other Ambulatory Visit: Payer: Self-pay | Admitting: Family Medicine

## 2023-06-14 DIAGNOSIS — I10 Essential (primary) hypertension: Secondary | ICD-10-CM

## 2023-06-14 NOTE — Telephone Encounter (Signed)
Patient would like amLODipine (NORVASC) 5 MG tablet  sent to     [facility] Phone: [phone]  Fax: [phone]      Patient states Rx was sent to the wrong pharmacy.

## 2023-06-15 ENCOUNTER — Other Ambulatory Visit: Payer: Self-pay

## 2023-06-17 ENCOUNTER — Other Ambulatory Visit: Payer: Self-pay

## 2023-06-18 ENCOUNTER — Other Ambulatory Visit: Payer: Self-pay

## 2023-06-18 MED ORDER — METFORMIN HCL 500 MG PO TABS: 1000.0000 mg | ORAL_TABLET | Freq: Two times a day (BID) | ORAL | 1 refills | Status: DC

## 2023-06-18 MED ORDER — AMLODIPINE BESYLATE 5 MG PO TABS: 5.0000 mg | ORAL_TABLET | Freq: Every day | ORAL | 1 refills | Status: DC

## 2023-06-18 MED ORDER — AMLODIPINE BESYLATE 5 MG PO TABS
5.0000 mg | ORAL_TABLET | Freq: Every day | ORAL | 1 refills | Status: DC
  Filled 2023-06-18 – 2023-06-21 (×2): qty 90, 90d supply, fill #0

## 2023-06-18 NOTE — Telephone Encounter (Signed)
 Requested Prescriptions  Pending Prescriptions Disp Refills   amLODipine  (NORVASC ) 5 MG tablet 90 tablet 1    Sig: Take 1 tablet (5 mg total) by mouth daily.     Cardiovascular: Calcium  Channel Blockers 2 Passed - 06/18/2023  1:11 PM      Passed - Last BP in normal range    BP Readings from Last 1 Encounters:  05/17/23 128/70         Passed - Last Heart Rate in normal range    Pulse Readings from Last 1 Encounters:  05/17/23 63         Passed - Valid encounter within last 6 months    Recent Outpatient Visits           1 month ago Screening for colon cancer   [facility] [place]. [facility] Delbert Clam, MD   1 month ago Type 2 diabetes mellitus without complication, without long-term current use of insulin (HCC)   [facility] [place]. [facility] Fleeta Tonia Garnette LITTIE, RPH-CPP   2 months ago Rectal tear   [facility] [place]. [facility] Vicci Sober B, MD   2 months ago Type 2 diabetes mellitus without complication, without long-term current use of insulin (HCC)   [facility] [place]. [facility] Fleeta Tonia, Roseto L, RPH-CPP   3 months ago Type 2 diabetes mellitus without complication, without long-term current use of insulin (HCC)   [facility] [place]. [facility] Fleeta Tonia Garnette LITTIE, RPH-CPP       Future Appointments             In 1 month Fleeta Tonia, Garnette LITTIE, RPH-CPP  [facility] [place]. [facility]   In 5 months Delbert Clam, MD [facility] [place] - [facility] Dept Of [place]. [facility]

## 2023-06-21 ENCOUNTER — Other Ambulatory Visit: Payer: Self-pay

## 2023-06-22 ENCOUNTER — Other Ambulatory Visit: Payer: Self-pay

## 2023-08-02 ENCOUNTER — Ambulatory Visit: Payer: Medicaid Other | Admitting: Pharmacist

## 2023-08-23 ENCOUNTER — Ambulatory Visit: Payer: Medicaid Other | Admitting: Family Medicine

## 2023-08-31 ENCOUNTER — Encounter: Payer: Self-pay | Admitting: Pharmacist

## 2023-08-31 ENCOUNTER — Other Ambulatory Visit: Payer: Self-pay

## 2023-08-31 ENCOUNTER — Ambulatory Visit: Payer: Self-pay | Attending: Family Medicine | Admitting: Pharmacist

## 2023-08-31 DIAGNOSIS — Z7984 Long term (current) use of oral hypoglycemic drugs: Secondary | ICD-10-CM

## 2023-08-31 DIAGNOSIS — E785 Hyperlipidemia, unspecified: Secondary | ICD-10-CM

## 2023-08-31 DIAGNOSIS — E119 Type 2 diabetes mellitus without complications: Secondary | ICD-10-CM | POA: Insufficient documentation

## 2023-08-31 DIAGNOSIS — I10 Essential (primary) hypertension: Secondary | ICD-10-CM

## 2023-08-31 LAB — POCT GLYCOSYLATED HEMOGLOBIN (HGB A1C): HbA1c, POC (controlled diabetic range): 7.6 % — AB (ref 0.0–7.0)

## 2023-08-31 MED ORDER — METFORMIN HCL 500 MG PO TABS
1000.0000 mg | ORAL_TABLET | Freq: Two times a day (BID) | ORAL | 1 refills | Status: DC
  Filled 2023-08-31: qty 360, 90d supply, fill #0

## 2023-08-31 MED ORDER — AMLODIPINE BESYLATE 5 MG PO TABS
5.0000 mg | ORAL_TABLET | Freq: Every day | ORAL | 1 refills | Status: DC
  Filled 2023-08-31: qty 90, 90d supply, fill #0

## 2023-08-31 NOTE — Progress Notes (Signed)
    SMarland Kitchen    TranslatorElita Quick # [phone]  63 y.o. female who presents for diabetes evaluation, education, and management. PMH is significant for T2DM, HTN, HLD, hx Hep C. Of note, I have seen her before. She completed [name] 10/2022 - 04/2023 with an overall A1c improvement from 8.7 to 6.9%.  Patient was referred and last seen by PCP, Dr. Alvis Lemmings, on 05/17/2023. Her A1c today has increased to 7.6%.  Today, patient presents with her daughter. Reports adherence with metformin 500 mg tablets - takes 2 tablets (1000mg  total) in the morning. No GI upset or diarrhea. She has no complaints regarding her DM. Is endorsing continued hemorrhoidal pain. She never did follow-up with GI after her last PCP visit.  Family/Social History:  Fhx: HTN, stroke, cancer Tobacco: never smoker  Insurance coverage: [place] [employer] Family [employer]  Current diabetes medications include: metformin 1000 mg once daily (is taking the 500 mg immediate release tablets).  Current hypertension medications include: amlodipine 5 mg Current hyperlipidemia medications include: atorvastatin 20 mg  Patient denies hypoglycemic events.  Patient denies nocturia (nighttime urination).  Patient denies neuropathy (nerve pain). Patient denies visual changes. Patient reports self foot exams.   Patient reported dietary habits: Eats 2-3 meals/day -Breakfast: 2 cookies, little bit of milk, yogurt -Continuing to try to limit carbs, starches, and sweets. -Her [name] is helping her track her diet better   Patient-reported exercise habits:  - Endorses 2 days of exercise recently by walking around the house for 30 minutes   O:  No GM or CGM present for review today.  Lab Results  Component Value Date   HGBA1C 7.6 (A) 08/31/2023   Lipid Panel     Component Value Date/Time   CHOL 145 01/27/2023 0847   TRIG 95 01/27/2023 0847   HDL 57 01/27/2023 0847   CHOLHDL 4.0 11/29/2019 0959   LDLCALC 70 01/27/2023 0847    Clinical  Atherosclerotic Cardiovascular Disease (ASCVD): No  The 10-year ASCVD risk score (Arnett DK, et al., 2019) is: 10.4%   Values used to calculate the score:     Age: 73 years     Sex: Female     Is Non-Hispanic African American: No     Diabetic: Yes     Tobacco smoker: No     Systolic Blood Pressure: 128 mmHg     Is BP treated: Yes     HDL Cholesterol: 57 mg/dL     Total Cholesterol: 145 mg/dL   A/P: Diabetes longstanding, currently above goal based on A1c today of 7.6%, which has increased from previous A1c of 6.9%. She is asymptomatic from a hypo- or hyperglycemic standpoint today. Patient is able to verbalize appropriate hypoglycemia management plan. - -Increase metformin to 1000 mg BID. Pt to take two 500 mg tablets BID. -Extensively discussed pathophysiology of diabetes, recommended lifestyle interventions, dietary effects on blood sugar control.  -Counseled on s/sx of and management of hypoglycemia.  -Next A1c anticipated 11/2023. -Encouraged patient to contact GI for hemorrhoid follow-up. -UACR, CMP14+eGFR  Written patient instructions provided. Patient verbalized understanding of treatment plan.  Total time in face to face counseling 30 minutes.    Follow-up:  Pharmacist: in 1 month to reassess CBG control.   Butch Penny, PharmD, Patsy [place], CPP Clinical Pharmacist [facility] & [facility] [phone]

## 2023-09-02 ENCOUNTER — Other Ambulatory Visit: Payer: Self-pay

## 2023-09-02 LAB — CMP14+EGFR
ALT: 47 IU/L — ABNORMAL HIGH (ref 0–32)
AST: 22 IU/L (ref 0–40)
Albumin: 4.2 g/dL (ref 3.9–4.9)
Alkaline Phosphatase: 81 IU/L (ref 44–121)
BUN/Creatinine Ratio: 24 (ref 12–28)
BUN: 14 mg/dL (ref 8–27)
Bilirubin Total: 0.3 mg/dL (ref 0.0–1.2)
CO2: 22 mmol/L (ref 20–29)
Calcium: 9.4 mg/dL (ref 8.7–10.3)
Chloride: 102 mmol/L (ref 96–106)
Creatinine, Ser: 0.59 mg/dL (ref 0.57–1.00)
Globulin, Total: 2.7 g/dL (ref 1.5–4.5)
Glucose: 158 mg/dL — ABNORMAL HIGH (ref 70–99)
Potassium: 4.6 mmol/L (ref 3.5–5.2)
Sodium: 139 mmol/L (ref 134–144)
Total Protein: 6.9 g/dL (ref 6.0–8.5)
eGFR: 101 mL/min/{1.73_m2} (ref >59)

## 2023-09-02 LAB — MICROALBUMIN / CREATININE URINE RATIO
Creatinine, Urine: 106.1 mg/dL
Microalb/Creat Ratio: 8 mg/g{creat} (ref 0–29)
Microalbumin, Urine: 8.7 ug/mL

## 2023-10-12 ENCOUNTER — Encounter: Payer: Self-pay | Admitting: Pharmacist

## 2023-10-12 ENCOUNTER — Ambulatory Visit: Attending: Family Medicine | Admitting: Pharmacist

## 2023-10-12 DIAGNOSIS — E119 Type 2 diabetes mellitus without complications: Secondary | ICD-10-CM

## 2023-10-12 DIAGNOSIS — Z7984 Long term (current) use of oral hypoglycemic drugs: Secondary | ICD-10-CM | POA: Diagnosis not present

## 2023-10-12 NOTE — Progress Notes (Signed)
    S:    TranslatorIna Manas # [phone]  63 y.o. female who presents for diabetes evaluation, education, and management. PMH is significant for T2DM, HTN, HLD, hx Hep C. Of note, I have seen her before. She completed [name] 10/2022 - 04/2023 with an overall A1c improvement from 8.7 to 6.9%.  Since completing [name], patient was referred and last seen by PCP, Dr. Adan Holms, on 05/17/2023. I saw her again on 08/31/2023 and A1c had increased to 7.6%. I increased metformin  to 1000 mg BID.  Today, patient presents by herself. Reports adherence with metformin  500 mg tablets - takes 2 tablets (1000mg  total) BID as instructed. No GI upset or diarrhea. She has no complaints regarding her DM.   Family/Social History:  Fhx: HTN, stroke, cancer Tobacco: never smoker  Insurance coverage: [place] Medicaid   Current diabetes medications include: metformin  1000 mg BID (is taking two of the 500 mg immediate release tablets BID).  Current hypertension medications include: amlodipine  5 mg Current hyperlipidemia medications include: atorvastatin  20 mg  Patient denies hypoglycemic events.  Patient denies nocturia (nighttime urination).  Patient denies neuropathy (nerve pain). Patient denies visual changes. Patient reports self foot exams.   Patient reported dietary habits: Eats 2-3 meals/day -Breakfast: 2 cookies, little bit of milk, yogurt -Continuing to try to limit carbs, starches, and sweets. -Her Jerrilyn Moras is helping her track her diet better   Patient-reported exercise habits:  - Endorses 2 days of exercise recently by walking around the house for 30 minutes   O:  No GM or CGM present for review today.  Lab Results  Component Value Date   HGBA1C 7.6 (A) 08/31/2023   Lipid Panel     Component Value Date/Time   CHOL 145 01/27/2023 0847   TRIG 95 01/27/2023 0847   HDL 57 01/27/2023 0847   CHOLHDL 4.0 11/29/2019 0959   LDLCALC 70 01/27/2023 0847    Clinical Atherosclerotic Cardiovascular  Disease (ASCVD): No  The 10-year ASCVD risk score (Arnett DK, et al., 2019) is: 11.7%   Values used to calculate the score:     Age: 81 years     Sex: Female     Is Non-Hispanic African American: No     Diabetic: Yes     Tobacco smoker: No     Systolic Blood Pressure: 128 mmHg     Is BP treated: Yes     HDL Cholesterol: 57 mg/dL     Total Cholesterol: 145 mg/dL   A/P: Diabetes longstanding, currently above goal based on A1c 7.6% last month. which has increased from previous A1c of 6.9%. She is asymptomatic from a hypo- or hyperglycemic standpoint today. Patient is able to verbalize appropriate hypoglycemia management plan. She is adherent to the increased metformin  dosing and denies any side effects. Will have her continue these and follow-up with PCP in June.  -Increase metformin  to 1000 mg BID. Pt to take two 500 mg tablets BID. -Extensively discussed pathophysiology of diabetes, recommended lifestyle interventions, dietary effects on blood sugar control.  -Counseled on s/sx of and management of hypoglycemia.  -Next A1c anticipated 11/2023.  Written patient instructions provided. Patient verbalized understanding of treatment plan.  Total time in face to face counseling 30 minutes.    Follow-up:  PCP: 11/15/2023  Marene Shape, PharmD, BCACP, CPP Clinical Pharmacist [facility] & [facility] [phone]

## 2023-11-09 ENCOUNTER — Telehealth: Payer: Self-pay

## 2023-11-09 NOTE — Telephone Encounter (Signed)
 Copied from CRM [id number]. Topic: Appointments - Scheduling Inquiry for Clinic >> Nov 09, 2023 12:11 PM Elle L wrote: Reason for CRM: The patient's daughter is requesting to re-schedule the patient's appointment on 5/29 with Kandra Orn Ausdall.   We spoke using interpreter Rafaela ID [id number]. Her call back number is [phone].

## 2023-11-11 ENCOUNTER — Ambulatory Visit: Admitting: Pharmacist

## 2023-11-15 ENCOUNTER — Encounter: Payer: Self-pay | Admitting: Family Medicine

## 2023-11-15 ENCOUNTER — Other Ambulatory Visit: Payer: Self-pay

## 2023-11-15 ENCOUNTER — Ambulatory Visit: Payer: Medicaid Other | Attending: Family Medicine | Admitting: Family Medicine

## 2023-11-15 VITALS — BP 113/71 | HR 63 | Ht 60.0 in | Wt 130.0 lb

## 2023-11-15 DIAGNOSIS — E785 Hyperlipidemia, unspecified: Secondary | ICD-10-CM | POA: Diagnosis not present

## 2023-11-15 DIAGNOSIS — I10 Essential (primary) hypertension: Secondary | ICD-10-CM

## 2023-11-15 DIAGNOSIS — Z7984 Long term (current) use of oral hypoglycemic drugs: Secondary | ICD-10-CM

## 2023-11-15 DIAGNOSIS — E119 Type 2 diabetes mellitus without complications: Secondary | ICD-10-CM

## 2023-11-15 MED ORDER — ATORVASTATIN CALCIUM 20 MG PO TABS
20.0000 mg | ORAL_TABLET | Freq: Every day | ORAL | 1 refills | Status: DC
  Filled 2023-11-15: qty 90, 90d supply, fill #0
  Filled 2024-03-16: qty 90, 90d supply, fill #1

## 2023-11-15 MED ORDER — AMLODIPINE BESYLATE 5 MG PO TABS
5.0000 mg | ORAL_TABLET | Freq: Every day | ORAL | 1 refills | Status: DC
  Filled 2023-11-15: qty 90, 90d supply, fill #0
  Filled 2024-03-16: qty 90, 90d supply, fill #1

## 2023-11-15 MED ORDER — METFORMIN HCL 500 MG PO TABS
1000.0000 mg | ORAL_TABLET | Freq: Two times a day (BID) | ORAL | 1 refills | Status: DC
  Filled 2023-11-15 – 2024-03-16 (×2): qty 360, 90d supply, fill #0

## 2023-11-15 NOTE — Patient Instructions (Signed)
 Diabetes mellitus y nutricin, en adultos Diabetes Mellitus and Nutrition, Adult Si sufre de diabetes, o diabetes mellitus, es muy importante tener hbitos alimenticios saludables debido a que sus niveles de Psychologist, counselling sangre (glucosa) se ven afectados en gran medida por lo que come y bebe. Comer alimentos saludables en las cantidades correctas, aproximadamente a la misma hora todos los [place], [place] ayudar a: Chief Operating Officer su glucemia. Disminuir el riesgo de sufrir una enfermedad cardaca. Mejorar la presin arterial. Barista o mantener un peso saludable. Qu puede afectar mi plan de alimentacin? Todas las personas que sufren de diabetes son diferentes y cada una tiene necesidades diferentes en cuanto a un plan de alimentacin. El mdico puede recomendarle que trabaje con un nutricionista para elaborar el mejor plan para usted. Su plan de alimentacin puede variar segn factores como: Las caloras que necesita. Los medicamentos que toma. Su peso. Sus niveles de glucemia, presin arterial y colesterol. Su nivel de [place]. Otras afecciones que tenga, como enfermedades cardacas o renales. Cmo me afectan los carbohidratos? Los carbohidratos, o hidratos de carbono, afectan su nivel de glucemia ms que cualquier otro tipo de alimento. La ingesta de carbohidratos aumenta la cantidad de [employer]. Es importante conocer la cantidad de carbohidratos que se pueden ingerir en cada comida sin correr Surveyor, minerals. Esto es Government social research officer. Su nutricionista puede ayudarlo a calcular la cantidad de carbohidratos que debe ingerir en cada comida y en cada refrigerio. Cmo me afecta el alcohol? El alcohol puede provocar una disminucin de la glucemia (hipoglucemia), especialmente si [place] insulina o toma determinados medicamentos por va oral para la diabetes. La hipoglucemia es una afeccin potencialmente mortal. Los sntomas de la hipoglucemia, como somnolencia, mareos y confusin, son  similares a los sntomas de haber consumido demasiado alcohol. No beba alcohol si: Su mdico le indica no hacerlo. Est embarazada, puede estar embarazada o est tratando de [place]. Si bebe alcohol: Limite la cantidad que bebe a lo siguiente: De 0 a 1 medida por da para las mujeres. De 0 a 2 medidas por da para los hombres. Sepa cunta cantidad de alcohol hay en las bebidas que toma. En los [address], una medida equivale a una botella de cerveza de 12 oz (355 ml), un vaso de vino de 5 oz (148 ml) o un vaso de una bebida alcohlica de alta graduacin de 1 oz (44 ml). Mantngase hidratado bebiendo agua, refrescos dietticos o t helado sin azcar. Tenga en cuenta que los refrescos comunes, los jugos y otras bebidas para mezclar pueden contener Product/process development scientist y se deben contar como carbohidratos. Consejos para seguir Social worker las etiquetas de los alimentos Comience por leer el tamao de la porcin en la etiqueta de Informacin nutricional de los alimentos envasados y las bebidas. La cantidad de caloras, carbohidratos, grasas y otros nutrientes detallados en la etiqueta se basan en una porcin del alimento. Muchos alimentos contienen ms de una porcin por envase. Verifique la cantidad total de gramos (g) de carbohidratos totales en una porcin. Verifique la cantidad de gramos de grasas saturadas y grasas trans en una porcin. Escoja alimentos que no contengan estas grasas o que su contenido de estas sea [place]. Verifique la cantidad de miligramos (mg) de sal (sodio) en una porcin. La [employer] de las personas deben limitar la ingesta de sodio total a menos de 2300 mg Google. Siempre consulte la informacin nutricional de los alimentos etiquetados como "con bajo contenido de grasa" o "sin grasa".  Estos alimentos pueden tener un mayor contenido de International aid/development worker agregada o carbohidratos refinados, y deben evitarse. Hable con su nutricionista para identificar sus objetivos diarios en  cuanto a los nutrientes mencionados en la etiqueta. Al ir de compras Evite comprar alimentos procesados, enlatados o precocidos. Estos alimentos tienden a Counselling psychologist mayor cantidad de [place], sodio y azcar agregada. Compre en la zona exterior de la tienda de comestibles. Esta es la zona donde se encuentran con mayor frecuencia las frutas y las verduras frescas, los cereales a granel, las carnes frescas y los productos lcteos frescos. Al cocinar Use mtodos de coccin a baja temperatura, como hornear, en lugar de mtodos de coccin a alta temperatura, como frer en abundante aceite. Cocine con aceites saludables, como el aceite de [place], canola o [place]. Evite cocinar con manteca, crema o carnes con alto contenido de grasa. Planificacin de las comidas Coma las comidas y los refrigerios regularmente, preferentemente a la misma hora todos [place]. Evite pasar largos perodos de tiempo sin comer. Consuma alimentos ricos en fibra, como frutas frescas, verduras, frijoles y cereales integrales. Consuma entre 4 y 6 onzas (entre 112 y 168 g) de protenas magras por da, como carnes [place], pollo, pescado, huevos o tofu. Una onza (oz) (28 g) de protena magra equivale a: 1 onza (28 g) de carne, pollo o pescado. 1 huevo.  taza (62 g) de tofu. Coma algunos alimentos por da que contengan grasas saludables, como aguacates, frutos secos, semillas y pescado. Qu alimentos debo comer? Nils Pyle Bayas. Manzanas. Naranjas. Duraznos. Damascos. Ciruelas. Uvas. Mangos. Papayas. Granadas. Kiwi. Cerezas. Verduras Verduras de [employer], que incluyen [place], [place], col rizada, acelga, hojas de berza, hojas de mostaza y repollo. Remolachas. Coliflor. Brcoli. Zanahorias. Judas verdes. Tomates. Pimientos. Cebollas. Pepinos. Coles de Bruselas. Granos Granos integrales, como panes, galletas, tortillas, cereales y pastas de salvado o integrales. Avena sin azcar. Quinua. Arroz integral o salvaje. Carnes y otras  protenas Frutos de mar. Carne de ave sin piel. Cortes magros de ave y carne de res. Tofu. Frutos secos. Semillas. Lcteos Productos lcteos sin grasa o con bajo contenido de [place], [place], yogur y [place]. Es posible que los productos detallados arriba no constituyan una lista completa de los alimentos y las bebidas que puede tomar. Consulte a un nutricionista para obtener ms informacin. Qu alimentos debo evitar? Nils Pyle Frutas enlatadas al almbar. Verduras Verduras enlatadas. Verduras congeladas con mantequilla o salsa de crema. Granos Productos elaborados con [place] y [place], como panes, pastas, bocadillos y cereales. Evite todos los alimentos procesados. Carnes y [address] de carne con alto contenido de Holiday representative. Carne de ave con piel. Carnes empanizadas o fritas. Carne procesada. Evite las grasas saturadas. Lcteos Yogur, queso o Cardinal Health. Bebidas Bebidas azucaradas, como gaseosas o t helado. Es posible que los productos que se enumeran ms [place] no constituyan una lista completa de los alimentos y las bebidas que Personnel officer. Consulte a un nutricionista para obtener ms informacin. Preguntas para hacerle al mdico Debo consultar con un especialista certificado en atencin y educacin sobre la diabetes? Es necesario que me rena con un nutricionista? A qu nmero puedo llamar si tengo preguntas? Cules son los mejores momentos para controlar la glucemia? Dnde encontrar ms informacin: American Diabetes Association (Asociacin Estadounidense de la Diabetes): diabetes.org Academy of Nutrition and Dietetics (Academia de Nutricin y Pension scheme manager): eatright.Dana Corporation of Diabetes and Digestive and Kidney Diseases [employer]ere & Company de la Diabetes y las Enfermedades Digestivas y Renales): StageSync.si Association of Diabetes  Care & Education Specialists (Asociacin de Especialistas en Atencin y Francella Solian la Diabetes):  diabeteseducator.org Resumen Es importante tener hbitos alimenticios saludables debido a que sus niveles de Psychologist, counselling sangre (glucosa) se ven afectados en gran medida por lo que come y bebe. Es importante consumir alcohol con prudencia. Un plan de comidas saludable lo ayudar a controlar la glucosa en sangre y a reducir el riesgo de enfermedades cardacas. El mdico puede recomendarle que trabaje con un nutricionista para elaborar el mejor plan para usted. Esta informacin no tiene Theme park manager el consejo del mdico. Asegrese de hacerle al mdico cualquier pregunta que tenga. Document Revised: 02/07/2020 Document Reviewed: 02/07/2020 Elsevier Patient Education  2024 [employer].

## 2023-11-15 NOTE — Progress Notes (Signed)
 Subjective:  Patient ID: Deborah Lyons, female    DOB: 10/30/1960  Age: 63 y.o. MRN: [id number]  CC: Medical Management of Chronic Issues     Discussed the use of AI scribe software for clinical note transcription with the patient, who gave verbal consent to proceed.  History of Present Illness Deborah Lyons is a 63 year old female with a history of Type 2 DM, HTN, Hyperlipidemia. who presents for a routine follow-up visit.  She has hypertension, managed with amlodipine  5 mg daily, with well-controlled blood pressure. No ankle swelling is noted. Hyperlipidemia is managed with atorvastatin  20 mg daily, with cholesterol levels last checked in August and a recheck planned for today. Her A1c was 7.6 in March, and she is on metformin , two tablets (1000mg ) twice a day, for diabetes.  A1c was previously 6.9 in the fall of last year while she was on the 1000 mg daily but this was increased to 1000 mg twice daily at her clinical pharmacy visit 2 months ago. Denies presence of additional concerns today.   Past Medical History:  Diagnosis Date   Diabetes mellitus without complication (HCC)    Hypertension     Past Surgical History:  Procedure Laterality Date   arm surgery Left 06/2019   surgery to remove fat tissue (lump) done in [place]   CESAREAN SECTION      Family History  Problem Relation Age of Onset   Hypertension Mother    Breast cancer Sister    Stroke Brother     Social History   Socioeconomic History   Marital status: Married    Spouse name: Not on file   Number of children: 4   Years of education: Not on file   Highest education level: 3rd grade  Occupational History   Not on file  Tobacco Use   Smoking status: Never   Smokeless tobacco: Never  Vaping Use   Vaping status: Never Used  Substance and Sexual Activity   Alcohol use: No   Drug use: No   Sexual activity: Not Currently    Birth control/protection: Post-menopausal  Other Topics  Concern   Not on file  Social History Narrative   Not on file   Social Drivers of Health   Financial Resource Strain: Medium Risk (04/19/2023)   Overall Financial Resource Strain (CARDIA)    Difficulty of Paying Living Expenses: Somewhat hard  Food Insecurity: No Food Insecurity (04/19/2023)   Hunger Vital Sign    Worried About Running Out of Food in the Last Year: Never true    Ran Out of Food in the Last Year: Never true  Transportation Needs: No Transportation Needs (04/19/2023)   PRAPARE - Administrator, Civil Service (Medical): No    Lack of Transportation (Non-Medical): No  Physical Activity: Inactive (04/19/2023)   Exercise Vital Sign    Days of Exercise per Week: 0 days    Minutes of Exercise per Session: 0 min  Stress: No Stress Concern Present (04/19/2023)   [employer] of Occupational Health - Occupational Stress Questionnaire    Feeling of Stress : Not at all  Social Connections: Moderately Integrated (04/19/2023)   Social Connection and Isolation Panel [NHANES]    Frequency of Communication with Friends and Family: Twice a week    Frequency of Social Gatherings with Friends and Family: Once a week    Attends Religious Services: More than 4 times per year    Active Member of [employer]  or Organizations: Yes    Attends Banker Meetings: Never    Marital Status: Separated    No Known Allergies  Outpatient Medications Prior to Visit  Medication Sig Dispense Refill   amLODipine  (NORVASC ) 5 MG tablet Take 1 tablet (5 mg total) by mouth daily. 90 tablet 1   atorvastatin  (LIPITOR) 20 MG tablet Take 1 tablet (20 mg total) by mouth daily. 90 tablet 1   metFORMIN  (GLUCOPHAGE ) 500 MG tablet Take 2 tablets (1,000 mg total) by mouth 2 (two) times daily with a meal. 360 tablet 1   Blood Pressure Monitoring (BLOOD PRESSURE CUFF) MISC Take blood pressure 3-4 times weekly and write down numbers in a notebook to bring to primary care doctor appointment.  (Patient not taking: Reported on 11/15/2023) 1 each 0   diltiazem  2 % GEL Apply small amount to rectal area BID x 5 days (Patient not taking: Reported on 11/15/2023) 30 g 0   hydrocortisone -pramoxine (ANALPRAM  HC) 2.5-1 % rectal cream Place 1 Application rectally 3 (three) times daily. (Patient not taking: Reported on 11/15/2023) 30 g 3   No facility-administered medications prior to visit.     ROS Review of Systems  Constitutional:  Negative for activity change and appetite change.  HENT:  Negative for sinus pressure and sore throat.   Respiratory:  Negative for chest tightness, shortness of breath and wheezing.   Cardiovascular:  Negative for chest pain and palpitations.  Gastrointestinal:  Negative for abdominal distention, abdominal pain and constipation.  Genitourinary: Negative.   Musculoskeletal: Negative.   Psychiatric/Behavioral:  Negative for behavioral problems and dysphoric mood.     Objective:  BP 113/71   Pulse 63   Ht 5' (1.524 m)   Wt 130 lb (59 kg)   SpO2 98%   BMI 25.39 kg/m      11/15/2023    9:02 AM 05/17/2023    9:26 AM 04/19/2023   11:17 AM  BP/Weight  Systolic BP 113 128 123  Diastolic BP 71 70 74  Wt. (Lbs) 130 129.2 127.2  BMI 25.39 kg/m2 25.23 kg/m2 24.84 kg/m2      Physical Exam Constitutional:      Appearance: She is well-developed.  Cardiovascular:     Rate and Rhythm: Normal rate.     Heart sounds: Normal heart sounds. No murmur heard. Pulmonary:     Effort: Pulmonary effort is normal.     Breath sounds: Normal breath sounds. No wheezing or rales.  Chest:     Chest wall: No tenderness.  Abdominal:     General: Bowel sounds are normal. There is no distension.     Palpations: Abdomen is soft. There is no mass.     Tenderness: There is no abdominal tenderness.  Musculoskeletal:        General: Normal range of motion.     Right lower leg: No edema.     Left lower leg: No edema.  Neurological:     Mental Status: She is alert and oriented  to person, place, and time.  Psychiatric:        Mood and Affect: Mood normal.        Latest Ref Rng & Units 08/31/2023    9:10 AM 01/27/2023    8:47 AM 10/20/2022    9:45 AM  CMP  Glucose 70 - 99 mg/dL 469  629  528   BUN 8 - 27 mg/dL 14  9  10    Creatinine 0.57 - 1.00 mg/dL 4.13  2.44  0.63   Sodium 134 - 144 mmol/L 139  140  138   Potassium 3.5 - 5.2 mmol/L 4.6  4.4  4.2   Chloride 96 - 106 mmol/L 102  102  100   CO2 20 - 29 mmol/L 22  25  22    Calcium  8.7 - 10.3 mg/dL 9.4  9.5  9.4   Total Protein 6.0 - 8.5 g/dL 6.9  7.3  7.2   Total Bilirubin 0.0 - 1.2 mg/dL 0.3  0.4  0.4   Alkaline Phos 44 - 121 IU/L 81  82  78   AST 0 - 40 IU/L 22  19  17    ALT 0 - 32 IU/L 47  48  34     Lipid Panel     Component Value Date/Time   CHOL 145 01/27/2023 0847   TRIG 95 01/27/2023 0847   HDL 57 01/27/2023 0847   CHOLHDL 4.0 11/29/2019 0959   LDLCALC 70 01/27/2023 0847    CBC    Component Value Date/Time   WBC 5.0 02/14/2015 1305   RBC 4.60 02/14/2015 1305   HGB 12.7 02/14/2015 1305   HCT 38.8 02/14/2015 1305   PLT 244 02/14/2015 1305   MCV 84.3 02/14/2015 1305   MCH 27.6 02/14/2015 1305   MCHC 32.7 02/14/2015 1305   RDW 13.1 02/14/2015 1305   LYMPHSABS 2.2 02/14/2015 1305   MONOABS 0.5 02/14/2015 1305   EOSABS 0.2 02/14/2015 1305   BASOSABS 0.0 02/14/2015 1305    Lab Results  Component Value Date   HGBA1C 7.6 (A) 08/31/2023    Lab Results  Component Value Date   HGBA1C 7.6 (A) 08/31/2023   HGBA1C 6.9 05/03/2023   HGBA1C 7.3 (A) 01/21/2023      1. Essential hypertension Controlled Counseled on blood pressure goal of less than 130/80, low-sodium, DASH diet, medication compliance, 150 minutes of moderate intensity exercise per week. Discussed medication compliance, adverse effects. - amLODipine  (NORVASC ) 5 MG tablet; Take 1 tablet (5 mg total) by mouth daily.  Dispense: 90 tablet; Refill: 1  2. Hyperlipidemia, unspecified hyperlipidemia type Controlled Continue  low-cholesterol diet - atorvastatin  (LIPITOR) 20 MG tablet; Take 1 tablet (20 mg total) by mouth daily.  Dispense: 90 tablet; Refill: 1 - LP+Non-HDL Cholesterol  3. Type 2 diabetes mellitus without complication, without long-term current use of insulin (HCC) (Primary) Suboptimally controlled with A1c of 7.6 Metformin  dose was increased 2 months ago so anticipate improvement in her A1c at her next visit Counseled on Diabetic diet, my plate method, 161 minutes of moderate intensity exercise/week Blood sugar logs with fasting goals of 80-120 mg/dl, random of less than 096 and in the event of sugars less than 60 mg/dl or greater than 045 mg/dl encouraged to notify the clinic. Advised on the need for annual eye exams, annual foot exams, Pneumonia vaccine. - metFORMIN  (GLUCOPHAGE ) 500 MG tablet; Take 2 tablets (1,000 mg total) by mouth 2 (two) times daily with a meal.  Dispense: 360 tablet; Refill: 1  4. Long term current use of oral hypoglycemic drug - metFORMIN  (GLUCOPHAGE ) 500 MG tablet; Take 2 tablets (1,000 mg total) by mouth 2 (two) times daily with a meal.  Dispense: 360 tablet; Refill: 1   Meds ordered this encounter  Medications   amLODipine  (NORVASC ) 5 MG tablet    Sig: Take 1 tablet (5 mg total) by mouth daily.    Dispense:  90 tablet    Refill:  1   atorvastatin  (LIPITOR) 20 MG tablet  Sig: Take 1 tablet (20 mg total) by mouth daily.    Dispense:  90 tablet    Refill:  1   metFORMIN  (GLUCOPHAGE ) 500 MG tablet    Sig: Take 2 tablets (1,000 mg total) by mouth 2 (two) times daily with a meal.    Dispense:  360 tablet    Refill:  1    Follow-up: Return in about 1 month (around 12/15/2023) for CPE/ Preventive Health Exam.       Joaquin Mulberry, MD, FAAFP. [facility] [place], [place] [phone]   11/15/2023, 9:29 AM

## 2023-11-16 ENCOUNTER — Ambulatory Visit: Payer: Self-pay | Admitting: Family Medicine

## 2023-11-16 LAB — LP+NON-HDL CHOLESTEROL
Cholesterol, Total: 161 mg/dL (ref 100–199)
HDL: 63 mg/dL (ref >39)
LDL Chol Calc (NIH): 83 mg/dL (ref 0–99)
Total Non-HDL-Chol (LDL+VLDL): 98 mg/dL (ref 0–129)
Triglycerides: 79 mg/dL (ref 0–149)
VLDL Cholesterol Cal: 15 mg/dL (ref 5–40)

## 2023-12-09 ENCOUNTER — Ambulatory Visit: Admitting: Pharmacist

## 2023-12-14 ENCOUNTER — Encounter: Payer: Self-pay | Admitting: Pharmacist

## 2023-12-14 ENCOUNTER — Ambulatory Visit: Attending: Family Medicine | Admitting: Pharmacist

## 2023-12-14 DIAGNOSIS — E119 Type 2 diabetes mellitus without complications: Secondary | ICD-10-CM

## 2023-12-14 DIAGNOSIS — I10 Essential (primary) hypertension: Secondary | ICD-10-CM

## 2023-12-14 DIAGNOSIS — Z7984 Long term (current) use of oral hypoglycemic drugs: Secondary | ICD-10-CM

## 2023-12-14 DIAGNOSIS — E785 Hyperlipidemia, unspecified: Secondary | ICD-10-CM

## 2023-12-14 LAB — POCT GLYCOSYLATED HEMOGLOBIN (HGB A1C): HbA1c, POC (controlled diabetic range): 6.8 % (ref 0.0–7.0)

## 2023-12-14 NOTE — Progress Notes (Signed)
 S:    TranslatorBETHA Lyons # [id number]  63 y.o. female who presents for diabetes evaluation, education, and management. PMH is significant for T2DM, HTN, HLD, hx Hep C. Of note, I have seen her before. She completed [name] 10/2022 - 04/2023 with an overall A1c improvement from 8.7 to 6.9%.  Since completing [name], patient was referred and last seen by PCP, Dr. Delbert, on 05/17/2023. I saw her subsequently on 08/31/2023 and 10/12/2023. Over that period of time, her A1c had increased to 7.6%. I increased metformin  to 1000 mg BID.  Most recently, she saw Dr. Newlin on 11/15/2023. At that visit, she endorsed adherence to her metformin .  Today, pt presents in good spirits. Reports adherence with metformin  500 mg tablets - takes 2 tablets (1000mg  total) BID as instructed. No GI upset or diarrhea. She has no complaints regarding her DM.   Family/Social History:  Fhx: HTN, stroke, cancer Tobacco: never smoker  Insurance coverage: [place] Medicaid   Current diabetes medications include: metformin  1000 mg BID (is taking two of the 500 mg immediate release tablets BID).  Current hypertension medications include: amlodipine  5 mg Current hyperlipidemia medications include: atorvastatin  20 mg  Patient denies hypoglycemic events.  Patient denies nocturia (nighttime urination).  Patient denies neuropathy (nerve pain). Patient denies visual changes. Patient reports self foot exams.   Patient reported dietary habits: Eats 2-3 meals/day -Breakfast: 2 cookies, little bit of milk, yogurt -Continuing to try to limit carbs, starches, and sweets. -Her Deborah is helping her track her diet better   Patient-reported exercise habits:  - Endorses 2 days of exercise recently by walking around the house for 30 minutes   O:  No GM or CGM present for review today.  Lab Results  Component Value Date   HGBA1C 6.8 12/14/2023   Lipid Panel     Component Value Date/Time   CHOL 161 11/15/2023 0932   TRIG 79  11/15/2023 0932   HDL 63 11/15/2023 0932   CHOLHDL 4.0 11/29/2019 0959   LDLCALC 83 11/15/2023 0932    Clinical Atherosclerotic Cardiovascular Disease (ASCVD): No  The 10-year ASCVD risk score (Arnett DK, et al., 2019) is: 9.3%   Values used to calculate the score:     Age: 57 years     Clincally relevant sex: Female     Is Non-Hispanic African American: No     Diabetic: Yes     Tobacco smoker: No     Systolic Blood Pressure: 113 mmHg     Is BP treated: Yes     HDL Cholesterol: 63 mg/dL     Total Cholesterol: 161 mg/dL   A/P: Diabetes longstanding, currently at goal of <7% based on A1c of 6.8% today. Commended her for this! She is asymptomatic from a hypo- or hyperglycemic standpoint today. Patient is able to verbalize appropriate hypoglycemia management plan. She is adherent to her metformin  and denies any side effects. Will have her continue these and follow-up with PCP in July. Me in 3 months.  -Continue metformin  1000 mg BID. Pt to take two 500 mg tablets BID. -Extensively discussed pathophysiology of diabetes, recommended lifestyle interventions, dietary effects on blood sugar control.  -Counseled on s/sx of and management of hypoglycemia.  -Next A1c anticipated 03/2024.  Written patient instructions provided. Patient verbalized understanding of treatment plan.  Total time in face to face counseling 30 minutes.    Follow-up:  PCP: next month. ME: 3 months  Deborah Lyons, PharmD, BCACP, CPP Clinical Pharmacist [facility] [phone]

## 2023-12-23 ENCOUNTER — Telehealth (INDEPENDENT_AMBULATORY_CARE_PROVIDER_SITE_OTHER): Payer: Self-pay

## 2023-12-23 NOTE — Telephone Encounter (Signed)
 Contacted pt and spoke with pt daughter to schedule mammogram  Pt is schedule for 01/21/24 at 10:40am  [employer] interpreter: nawabi ID: [id number]

## 2024-01-03 ENCOUNTER — Encounter: Payer: Self-pay | Admitting: Family Medicine

## 2024-01-03 ENCOUNTER — Ambulatory Visit: Attending: Family Medicine | Admitting: Family Medicine

## 2024-01-03 VITALS — BP 118/72 | HR 61 | Ht 60.0 in | Wt 125.8 lb

## 2024-01-03 DIAGNOSIS — Z1231 Encounter for screening mammogram for malignant neoplasm of breast: Secondary | ICD-10-CM

## 2024-01-03 DIAGNOSIS — Z0000 Encounter for general adult medical examination without abnormal findings: Secondary | ICD-10-CM

## 2024-01-03 DIAGNOSIS — E2839 Other primary ovarian failure: Secondary | ICD-10-CM

## 2024-01-03 DIAGNOSIS — Z23 Encounter for immunization: Secondary | ICD-10-CM

## 2024-01-03 DIAGNOSIS — Z1211 Encounter for screening for malignant neoplasm of colon: Secondary | ICD-10-CM

## 2024-01-03 NOTE — Progress Notes (Signed)
 Subjective:  Patient ID: Deborah Lyons, female    DOB: [birth year]  Age: 63 y.o. MRN: [id number]  CC: Annual Exam     Discussed the use of AI scribe software for clinical note transcription with the patient, who gave verbal consent to proceed.  History of Present Illness Deborah Lyons is a 63 year old female with a history of Type 2 DM, HTN, Hyperlipidemia who presents for an annual exam and routine cancer screenings.  She is due for a breast exam, mammogram, bone density test, and a stool test for colon cancer screening. A Pap smear is not required as she is over 5 years old.    Past Medical History:  Diagnosis Date   Diabetes mellitus without complication (HCC)    Hypertension     Past Surgical History:  Procedure Laterality Date   arm surgery Left 06/2019   surgery to remove fat tissue (lump) done in [place]   CESAREAN SECTION      Family History  Problem Relation Age of Onset   Hypertension Mother    Breast cancer Sister    Stroke Brother     Social History   Socioeconomic History   Marital status: Married    Spouse name: Not on file   Number of children: 4   Years of education: Not on file   Highest education level: 3rd grade  Occupational History   Not on file  Tobacco Use   Smoking status: Never   Smokeless tobacco: Never  Vaping Use   Vaping status: Never Used  Substance and Sexual Activity   Alcohol use: No   Drug use: No   Sexual activity: Not Currently    Birth control/protection: Post-menopausal  Other Topics Concern   Not on file  Social History Narrative   Not on file   Social Drivers of Health   Financial Resource Strain: Medium Risk (04/19/2023)   Overall Financial Resource Strain (CARDIA)    Difficulty of Paying Living Expenses: Somewhat hard  Food Insecurity: No Food Insecurity (04/19/2023)   Hunger Vital Sign    Worried About Running Out of Food in the Last Year: Never true    Ran Out of Food in the Last Year:  Never true  Transportation Needs: No Transportation Needs (04/19/2023)   PRAPARE - Administrator, Civil Service (Medical): No    Lack of Transportation (Non-Medical): No  Physical Activity: Inactive (04/19/2023)   Exercise Vital Sign    Days of Exercise per Week: 0 days    Minutes of Exercise per Session: 0 min  Stress: No Stress Concern Present (04/19/2023)   [employer] of Occupational Health - Occupational Stress Questionnaire    Feeling of Stress : Not at all  Social Connections: Moderately Integrated (04/19/2023)   Social Connection and Isolation Panel    Frequency of Communication with Friends and Family: Twice a week    Frequency of Social Gatherings with Friends and Family: Once a week    Attends Religious Services: More than 4 times per year    Active Member of [employer] or Organizations: Yes    Attends Banker Meetings: Never    Marital Status: Separated    No Known Allergies  Outpatient Medications Prior to Visit  Medication Sig Dispense Refill   amLODipine  (NORVASC ) 5 MG tablet Take 1 tablet (5 mg total) by mouth daily. 90 tablet 1   atorvastatin  (LIPITOR) 20 MG tablet Take 1 tablet (20 mg total) by  mouth daily. 90 tablet 1   metFORMIN  (GLUCOPHAGE ) 500 MG tablet Take 2 tablets (1,000 mg total) by mouth 2 (two) times daily with a meal. 360 tablet 1   Blood Pressure Monitoring (BLOOD PRESSURE CUFF) MISC Take blood pressure 3-4 times weekly and write down numbers in a notebook to bring to primary care doctor appointment. (Patient not taking: Reported on 01/03/2024) 1 each 0   diltiazem  2 % GEL Apply small amount to rectal area BID x 5 days (Patient not taking: Reported on 01/03/2024) 30 g 0   hydrocortisone -pramoxine (ANALPRAM  HC) 2.5-1 % rectal cream Place 1 Application rectally 3 (three) times daily. (Patient not taking: Reported on 01/03/2024) 30 g 3   No facility-administered medications prior to visit.     ROS Review of Systems  Constitutional:   Negative for activity change and appetite change.  HENT:  Negative for sinus pressure and sore throat.   Respiratory:  Negative for chest tightness, shortness of breath and wheezing.   Cardiovascular:  Negative for chest pain and palpitations.  Gastrointestinal:  Negative for abdominal distention, abdominal pain and constipation.  Genitourinary: Negative.   Musculoskeletal: Negative.   Psychiatric/Behavioral:  Negative for behavioral problems and dysphoric mood.     Objective:  BP 118/72   Pulse 61   Ht 5' (1.524 m)   Wt 125 lb 12.8 oz (57.1 kg)   SpO2 98%   BMI 24.57 kg/m      01/03/2024    3:23 PM 11/15/2023    9:02 AM 05/17/2023    9:26 AM  BP/Weight  Systolic BP 118 113 128  Diastolic BP 72 71 70  Wt. (Lbs) 125.8 130 129.2  BMI 24.57 kg/m2 25.39 kg/m2 25.23 kg/m2      Physical Exam Constitutional:      General: She is not in acute distress.    Appearance: She is well-developed. She is not diaphoretic.  HENT:     Head: Normocephalic.     Right Ear: External ear normal.     Left Ear: External ear normal.     Nose: Nose normal.     Mouth/Throat:     Mouth: Mucous membranes are moist.  Eyes:     Extraocular Movements: Extraocular movements intact.     Conjunctiva/sclera: Conjunctivae normal.     Pupils: Pupils are equal, round, and reactive to light.  Neck:     Vascular: No JVD.  Cardiovascular:     Rate and Rhythm: Normal rate and regular rhythm.     Pulses: Normal pulses.     Heart sounds: Normal heart sounds. No murmur heard.    No gallop.  Pulmonary:     Effort: Pulmonary effort is normal. No respiratory distress.     Breath sounds: Normal breath sounds. No wheezing or rales.  Chest:     Chest wall: No tenderness.  Breasts:    Right: No inverted nipple, mass or tenderness.     Left: No inverted nipple, mass or tenderness.  Abdominal:     General: Bowel sounds are normal. There is no distension.     Palpations: Abdomen is soft. There is no mass.      Tenderness: There is no abdominal tenderness.  Musculoskeletal:        General: No tenderness. Normal range of motion.     Cervical back: Normal range of motion.  Skin:    General: Skin is warm and dry.  Neurological:     Mental Status: She is alert and oriented to  person, place, and time.     Deep Tendon Reflexes: Reflexes are normal and symmetric.  Psychiatric:        Mood and Affect: Mood normal.        Latest Ref Rng & Units 08/31/2023    9:10 AM 01/27/2023    8:47 AM 10/20/2022    9:45 AM  CMP  Glucose 70 - 99 mg/dL 841  871  835   BUN 8 - 27 mg/dL 14  9  10    Creatinine 0.57 - 1.00 mg/dL 9.40  9.38  9.36   Sodium 134 - 144 mmol/L 139  140  138   Potassium 3.5 - 5.2 mmol/L 4.6  4.4  4.2   Chloride 96 - 106 mmol/L 102  102  100   CO2 20 - 29 mmol/L 22  25  22    Calcium  8.7 - 10.3 mg/dL 9.4  9.5  9.4   Total Protein 6.0 - 8.5 g/dL 6.9  7.3  7.2   Total Bilirubin 0.0 - 1.2 mg/dL 0.3  0.4  0.4   Alkaline Phos 44 - 121 IU/L 81  82  78   AST 0 - 40 IU/L 22  19  17    ALT 0 - 32 IU/L 47  48  34     Lipid Panel     Component Value Date/Time   CHOL 161 11/15/2023 0932   TRIG 79 11/15/2023 0932   HDL 63 11/15/2023 0932   CHOLHDL 4.0 11/29/2019 0959   LDLCALC 83 11/15/2023 0932    CBC    Component Value Date/Time   WBC 5.0 02/14/2015 1305   RBC 4.60 02/14/2015 1305   HGB 12.7 02/14/2015 1305   HCT 38.8 02/14/2015 1305   PLT 244 02/14/2015 1305   MCV 84.3 02/14/2015 1305   MCH 27.6 02/14/2015 1305   MCHC 32.7 02/14/2015 1305   RDW 13.1 02/14/2015 1305   LYMPHSABS 2.2 02/14/2015 1305   MONOABS 0.5 02/14/2015 1305   EOSABS 0.2 02/14/2015 1305   BASOSABS 0.0 02/14/2015 1305    Lab Results  Component Value Date   HGBA1C 6.8 12/14/2023      1. Annual physical exam Counseled on 150 minutes of exercise per week, healthy eating (including decreased daily intake of saturated fats, cholesterol, added sugars, sodium),routine healthcare maintenance.   2. Need for  Streptococcus pneumoniae vaccination (Primary) - Pneumococcal conjugate vaccine 20-valent  3. Encounter for screening mammogram for malignant neoplasm of breast - MS 3D SCR MAMMO BILAT BR (aka MM); Future  4. Estrogen deficiency - DG Bone Density; Future  5. Screening for colon cancer - Fecal occult blood, imunochemical  No orders of the defined types were placed in this encounter.   Follow-up: Return in about 3 months (around 04/04/2024) for Chronic medical conditions.       Corrina Sabin, MD, FAAFP. [facility] and [facility] [address] [place], [place] [phone]   01/03/2024, 3:55 PM

## 2024-01-03 NOTE — Patient Instructions (Signed)

## 2024-01-21 ENCOUNTER — Encounter

## 2024-03-15 ENCOUNTER — Telehealth: Payer: Self-pay | Admitting: Family Medicine

## 2024-03-15 NOTE — Telephone Encounter (Signed)
 Confirmed appt for 10/02

## 2024-03-16 ENCOUNTER — Other Ambulatory Visit (HOSPITAL_COMMUNITY): Payer: Self-pay

## 2024-03-16 ENCOUNTER — Other Ambulatory Visit: Payer: Self-pay

## 2024-03-16 ENCOUNTER — Encounter: Payer: Self-pay | Admitting: Pharmacist

## 2024-03-16 ENCOUNTER — Ambulatory Visit: Payer: Self-pay | Attending: Family Medicine | Admitting: Pharmacist

## 2024-03-16 DIAGNOSIS — E119 Type 2 diabetes mellitus without complications: Secondary | ICD-10-CM | POA: Insufficient documentation

## 2024-03-16 DIAGNOSIS — Z79899 Other long term (current) drug therapy: Secondary | ICD-10-CM | POA: Insufficient documentation

## 2024-03-16 DIAGNOSIS — I10 Essential (primary) hypertension: Secondary | ICD-10-CM | POA: Insufficient documentation

## 2024-03-16 DIAGNOSIS — E785 Hyperlipidemia, unspecified: Secondary | ICD-10-CM | POA: Insufficient documentation

## 2024-03-16 DIAGNOSIS — Z7984 Long term (current) use of oral hypoglycemic drugs: Secondary | ICD-10-CM | POA: Insufficient documentation

## 2024-03-16 LAB — POCT GLYCOSYLATED HEMOGLOBIN (HGB A1C): HbA1c, POC (controlled diabetic range): 6.7 % (ref 0.0–7.0)

## 2024-03-16 NOTE — Progress Notes (Signed)
 S:    TranslatorBETHA Lyons # [id number]  63 y.o. female who presents for diabetes evaluation, education, and management. PMH is significant for T2DM, HTN, HLD, hx Hep C. Of note, I have seen her before. She completed [name] 10/2022 - 04/2023 with an overall A1c improvement from 8.7 to 6.9%.  Since then, I've seen her several times this year. Her PCP last saw and referred her on 01/03/24. I last saw her on 12/14/2023. Her A1c at that visit with me was 6.8%.  Today, pt presents in good spirits. A1c is 6.7%. Reports adherence with metformin  500 mg tablets - takes 2 tablets (1000mg  total) BID as instructed. No GI upset or diarrhea. She has no complaints regarding her DM.   Family/Social History:  Fhx: HTN, stroke, cancer Tobacco: never smoker  Insurance coverage: [place] Medicaid   Current diabetes medications include: metformin  1000 mg BID (is taking two of the 500 mg immediate release tablets BID).  Current hypertension medications include: amlodipine  5 mg Current hyperlipidemia medications include: atorvastatin  20 mg  Patient denies hypoglycemic events.  Patient denies nocturia (nighttime urination).  Patient denies neuropathy (nerve pain). Patient denies visual changes. Patient reports self foot exams.   Patient reported dietary habits: Eats 2-3 meals/day -Breakfast: 2 cookies, little bit of milk, yogurt -Continuing to try to limit carbs, starches, and sweets. -Her Deborah is helping her track her diet better   Patient-reported exercise habits:  - Endorses 2 days of exercise recently by walking around the house for 30 minutes   O:  No GM or CGM present for review today.  Lab Results  Component Value Date   HGBA1C 6.7 03/16/2024   Lipid Panel     Component Value Date/Time   CHOL 161 11/15/2023 0932   TRIG 79 11/15/2023 0932   HDL 63 11/15/2023 0932   CHOLHDL 4.0 11/29/2019 0959   LDLCALC 83 11/15/2023 0932    Clinical Atherosclerotic Cardiovascular Disease (ASCVD): No  The  10-year ASCVD risk score (Arnett DK, et al., 2019) is: 10.1%   Values used to calculate the score:     Age: 57 years     Clincally relevant sex: Female     Is Non-Hispanic African American: No     Diabetic: Yes     Tobacco smoker: No     Systolic Blood Pressure: 118 mmHg     Is BP treated: Yes     HDL Cholesterol: 63 mg/dL     Total Cholesterol: 161 mg/dL   A/P: Diabetes longstanding, currently at goal of <7% based on A1c of 6.7% today. Commended her for this! She is asymptomatic from a hypo- or hyperglycemic standpoint today. Patient is able to verbalize appropriate hypoglycemia management plan. She is adherent to her metformin  and denies any side effects. Will have her continue these and follow-up with PCP later this month. Me in 3 months.  -Continue metformin  1000 mg BID. Pt to take two 500 mg tablets BID. -Refills for all medications given.  -Extensively discussed pathophysiology of diabetes, recommended lifestyle interventions, dietary effects on blood sugar control.  -Counseled on s/sx of and management of hypoglycemia.  -Next A1c anticipated 06/2024. -[facility].  -Gave instructions to complete her DEXA scan for osteoporosis screening.   Written patient instructions provided. Patient verbalized understanding of treatment plan.  Total time in face to face counseling 30 minutes.    Follow-up:  PCP: in 3 weeks ME: 3 months  Deborah Lyons  Tonia, PharmD, Deborah Lyons, CPP Clinical Pharmacist [facility] [phone]

## 2024-04-10 ENCOUNTER — Ambulatory Visit: Admitting: Family Medicine

## 2024-04-18 ENCOUNTER — Ambulatory Visit: Admitting: Family Medicine

## 2024-06-05 ENCOUNTER — Other Ambulatory Visit: Payer: Self-pay

## 2024-06-05 ENCOUNTER — Ambulatory Visit: Payer: Self-pay | Attending: Family Medicine | Admitting: Family Medicine

## 2024-06-05 ENCOUNTER — Encounter: Payer: Self-pay | Admitting: Family Medicine

## 2024-06-05 VITALS — BP 122/70 | HR 69 | Temp 97.3°F | Ht 60.0 in | Wt 126.0 lb

## 2024-06-05 DIAGNOSIS — E785 Hyperlipidemia, unspecified: Secondary | ICD-10-CM

## 2024-06-05 DIAGNOSIS — Z7984 Long term (current) use of oral hypoglycemic drugs: Secondary | ICD-10-CM

## 2024-06-05 DIAGNOSIS — Z23 Encounter for immunization: Secondary | ICD-10-CM

## 2024-06-05 DIAGNOSIS — E119 Type 2 diabetes mellitus without complications: Secondary | ICD-10-CM

## 2024-06-05 DIAGNOSIS — Z1211 Encounter for screening for malignant neoplasm of colon: Secondary | ICD-10-CM

## 2024-06-05 DIAGNOSIS — I10 Essential (primary) hypertension: Secondary | ICD-10-CM

## 2024-06-05 DIAGNOSIS — L84 Corns and callosities: Secondary | ICD-10-CM

## 2024-06-05 MED ORDER — ATORVASTATIN CALCIUM 20 MG PO TABS
20.0000 mg | ORAL_TABLET | Freq: Every day | ORAL | 1 refills | Status: AC
  Filled 2024-06-05: qty 90, 90d supply, fill #0

## 2024-06-05 MED ORDER — AMLODIPINE BESYLATE 5 MG PO TABS
5.0000 mg | ORAL_TABLET | Freq: Every day | ORAL | 1 refills | Status: AC
  Filled 2024-06-05: qty 90, 90d supply, fill #0

## 2024-06-05 MED ORDER — METFORMIN HCL 500 MG PO TABS
1000.0000 mg | ORAL_TABLET | Freq: Two times a day (BID) | ORAL | 1 refills | Status: AC
  Filled 2024-06-05: qty 360, 90d supply, fill #0

## 2024-06-05 NOTE — Progress Notes (Signed)
 "  Subjective:  Patient ID: Deborah Lyons, female    DOB: September 26, 1960  Age: 63 y.o. MRN: [id number]  CC: Medical Management of Chronic Issues     Discussed the use of AI scribe software for clinical note transcription with the patient, who gave verbal consent to proceed.  History of Present Illness Deborah Lyons is a 63 year old female with hyperlipidemia, hypertension and diabetes who presents for follow-up of her blood pressure and diabetes management.  Her most recent A1c is 6.7. She takes metformin  for diabetes. She continues her current dose of amlodipine  for blood pressure. Her most recent cholesterol test was normal, and she continues atorvastatin . Denies presence of additional concerns today.   Past Medical History:  Diagnosis Date   Diabetes mellitus without complication (HCC)    Hypertension     Past Surgical History:  Procedure Laterality Date   arm surgery Left 06/2019   surgery to remove fat tissue (lump) done in [place]   CESAREAN SECTION      Family History  Problem Relation Age of Onset   Hypertension Mother    Breast cancer Sister    Stroke Brother     Social History   Socioeconomic History   Marital status: Married    Spouse name: Not on file   Number of children: 4   Years of education: Not on file   Highest education level: 3rd grade  Occupational History   Not on file  Tobacco Use   Smoking status: Never   Smokeless tobacco: Never  Vaping Use   Vaping status: Never Used  Substance and Sexual Activity   Alcohol use: No   Drug use: No   Sexual activity: Not Currently    Birth control/protection: Post-menopausal  Other Topics Concern   Not on file  Social History Narrative   Not on file   Social Drivers of Health   Tobacco Use: Low Risk (06/05/2024)   Patient History    Smoking Tobacco Use: Never    Smokeless Tobacco Use: Never    Passive Exposure: Not on file  Financial Resource Strain: Medium Risk (04/19/2023)    Overall Financial Resource Strain (CARDIA)    Difficulty of Paying Living Expenses: Somewhat hard  Food Insecurity: No Food Insecurity (04/19/2023)   Hunger Vital Sign    Worried About Running Out of Food in the Last Year: Never true    Ran Out of Food in the Last Year: Never true  Transportation Needs: No Transportation Needs (04/19/2023)   PRAPARE - Administrator, Civil Service (Medical): No    Lack of Transportation (Non-Medical): No  Physical Activity: Inactive (04/19/2023)   Exercise Vital Sign    Days of Exercise per Week: 0 days    Minutes of Exercise per Session: 0 min  Stress: No Stress Concern Present (04/19/2023)   [employer] of Occupational Health - Occupational Stress Questionnaire    Feeling of Stress : Not at all  Social Connections: Moderately Integrated (04/19/2023)   Social Connection and Isolation Panel    Frequency of Communication with Friends and Family: Twice a week    Frequency of Social Gatherings with Friends and Family: Once a week    Attends Religious Services: More than 4 times per year    Active Member of [employer] or Organizations: Yes    Attends Banker Meetings: Never    Marital Status: Separated  Depression (PHQ2-9): Low Risk (01/03/2024)   Depression (PHQ2-9)  PHQ-2 Score: 0  Alcohol Screen: Low Risk (04/19/2023)   Alcohol Screen    Last Alcohol Screening Score (AUDIT): 0  Housing: Low Risk (04/19/2023)   Housing    Last Housing Risk Score: 0  Utilities: Not At Risk (04/19/2023)   AHC Utilities    Threatened with loss of utilities: No  Health Literacy: Adequate Health Literacy (04/19/2023)   B1300 Health Literacy    Frequency of need for help with medical instructions: Rarely    Allergies[1]  Outpatient Medications Prior to Visit  Medication Sig Dispense Refill   amLODipine  (NORVASC ) 5 MG tablet Take 1 tablet (5 mg total) by mouth daily. 90 tablet 1   atorvastatin  (LIPITOR) 20 MG tablet Take 1 tablet (20 mg total)  by mouth daily. 90 tablet 1   metFORMIN  (GLUCOPHAGE ) 500 MG tablet Take 2 tablets (1,000 mg total) by mouth 2 (two) times daily with a meal. 360 tablet 1   Blood Pressure Monitoring (BLOOD PRESSURE CUFF) MISC Take blood pressure 3-4 times weekly and write down numbers in a notebook to bring to primary care doctor appointment. (Patient not taking: Reported on 06/05/2024) 1 each 0   diltiazem  2 % GEL Apply small amount to rectal area BID x 5 days (Patient not taking: Reported on 06/05/2024) 30 g 0   hydrocortisone -pramoxine (ANALPRAM  HC) 2.5-1 % rectal cream Place 1 Application rectally 3 (three) times daily. (Patient not taking: Reported on 06/05/2024) 30 g 3   No facility-administered medications prior to visit.     ROS Review of Systems  Constitutional:  Negative for activity change and appetite change.  HENT:  Negative for sinus pressure and sore throat.   Respiratory:  Negative for chest tightness, shortness of breath and wheezing.   Cardiovascular:  Negative for chest pain and palpitations.  Gastrointestinal:  Negative for abdominal distention, abdominal pain and constipation.  Genitourinary: Negative.   Musculoskeletal: Negative.   Psychiatric/Behavioral:  Negative for behavioral problems and dysphoric mood.     Objective:  BP 122/70   Pulse 69   Temp (!) 97.3 F (36.3 C) (Oral)   Ht 5' (1.524 m)   Wt 126 lb (57.2 kg)   SpO2 97%   BMI 24.61 kg/m      06/05/2024    3:32 PM 01/03/2024    3:23 PM 11/15/2023    9:02 AM  BP/Weight  Systolic BP 122 118 113  Diastolic BP 70 72 71  Wt. (Lbs) 126 125.8 130  BMI 24.61 kg/m2 24.57 kg/m2 25.39 kg/m2      Physical Exam Constitutional:      Appearance: She is well-developed.  Cardiovascular:     Rate and Rhythm: Normal rate.     Heart sounds: Normal heart sounds. No murmur heard. Pulmonary:     Effort: Pulmonary effort is normal.     Breath sounds: Normal breath sounds. No wheezing or rales.  Chest:     Chest wall: No  tenderness.  Abdominal:     General: Bowel sounds are normal. There is no distension.     Palpations: Abdomen is soft. There is no mass.     Tenderness: There is no abdominal tenderness.  Musculoskeletal:        General: Normal range of motion.     Right lower leg: No edema.     Left lower leg: No edema.  Neurological:     Mental Status: She is alert and oriented to person, place, and time.  Psychiatric:  Mood and Affect: Mood normal.    Diabetic Foot Exam - Simple   Simple Foot Form Diabetic Foot exam was performed with the following findings: Yes 06/05/2024  4:10 PM  Visual Inspection See comments: Yes Sensation Testing Intact to touch and monofilament testing bilaterally: Yes Pulse Check Posterior Tibialis and Dorsalis pulse intact bilaterally: Yes Comments Right foot callus on medial aspect of great toe        Latest Ref Rng & Units 08/31/2023    9:10 AM 01/27/2023    8:47 AM 10/20/2022    9:45 AM  CMP  Glucose 70 - 99 mg/dL 841  871  835   BUN 8 - 27 mg/dL 14  9  10    Creatinine 0.57 - 1.00 mg/dL 9.40  9.38  9.36   Sodium 134 - 144 mmol/L 139  140  138   Potassium 3.5 - 5.2 mmol/L 4.6  4.4  4.2   Chloride 96 - 106 mmol/L 102  102  100   CO2 20 - 29 mmol/L 22  25  22    Calcium  8.7 - 10.3 mg/dL 9.4  9.5  9.4   Total Protein 6.0 - 8.5 g/dL 6.9  7.3  7.2   Total Bilirubin 0.0 - 1.2 mg/dL 0.3  0.4  0.4   Alkaline Phos 44 - 121 IU/L 81  82  78   AST 0 - 40 IU/L 22  19  17    ALT 0 - 32 IU/L 47  48  34     Lipid Panel     Component Value Date/Time   CHOL 161 11/15/2023 0932   TRIG 79 11/15/2023 0932   HDL 63 11/15/2023 0932   CHOLHDL 4.0 11/29/2019 0959   LDLCALC 83 11/15/2023 0932    CBC    Component Value Date/Time   WBC 5.0 02/14/2015 1305   RBC 4.60 02/14/2015 1305   HGB 12.7 02/14/2015 1305   HCT 38.8 02/14/2015 1305   PLT 244 02/14/2015 1305   MCV 84.3 02/14/2015 1305   MCH 27.6 02/14/2015 1305   MCHC 32.7 02/14/2015 1305   RDW 13.1  02/14/2015 1305   LYMPHSABS 2.2 02/14/2015 1305   MONOABS 0.5 02/14/2015 1305   EOSABS 0.2 02/14/2015 1305   BASOSABS 0.0 02/14/2015 1305    Lab Results  Component Value Date   HGBA1C 6.7 03/16/2024       Assessment & Plan Type 2 diabetes mellitus Well controlled with an A1c of 6.7%. - Continue metformin  500 mg oral bid. - Ordered comprehensive metabolic panel to assess kidney and liver function. -Counseled on Diabetic diet, the healthy plate, 849 minutes of moderate intensity exercise/week Blood sugar logs with fasting goals of 80-120 mg/dl, random of less than 819 and in the event of sugars less than 60 mg/dl or greater than 599 mg/dl encouraged to notify the clinic. Advised on the need for annual eye exams, annual foot exams, Pneumonia vaccine.   Essential hypertension Blood pressure is well controlled. - Continue amlodipine  5 mg oral daily. -Counseled on blood pressure goal of less than 130/80, low-sodium, DASH diet, medication compliance, 150 minutes of moderate intensity exercise per week. Discussed medication compliance, adverse effects.   Hyperlipidemia Cholesterol levels are excellent and within normal range. - Continue atorvastatin  20 mg oral daily. - Low-cholesterol diet   Callus of foot - Referred to podiatry   General Health Maintenance Due for colon cancer screening. Screening for colon cancer- Ordered stool test for colon cancer screening. - Referred to podiatrist  for sclerosis management.      Meds ordered this encounter  Medications   amLODipine  (NORVASC ) 5 MG tablet    Sig: Take 1 tablet (5 mg total) by mouth daily.    Dispense:  90 tablet    Refill:  1   atorvastatin  (LIPITOR) 20 MG tablet    Sig: Take 1 tablet (20 mg total) by mouth daily.    Dispense:  90 tablet    Refill:  1   metFORMIN  (GLUCOPHAGE ) 500 MG tablet    Sig: Take 2 tablets (1,000 mg total) by mouth 2 (two) times daily with a meal.    Dispense:  360 tablet    Refill:  1     Follow-up: Return in about 6 months (around 12/04/2024) for Chronic medical conditions.       Corrina Sabin, MD, FAAFP. [facility] [place], [place] [phone]   06/05/2024, 5:43 PM    [1] No Known Allergies  "

## 2024-06-05 NOTE — Patient Instructions (Signed)
 La diabetes mellitus y el cuidado de los pies Diabetes Mellitus and Foot Care La diabetes, tambin llamada diabetes mellitus, puede causar problemas en los pies y las piernas debido a un flujo sanguneo (circulacin) deficiente. La mala circulacin puede hacer que la piel: Se torne ms fina y seca. Se resquebraje ms fcilmente. Cicatrice ms lentamente. Se descame y agriete. Tambin puede presentar tener dao nervioso (neuropata). Esto puede causar una disminucin de la sensibilidad en las piernas y los pies. En consecuencia, es posible que no advierta heridas pequeas en los pies que pueden causar problemas ms graves. Identificar problemas y tratarlos lo antes posible es la mejor manera de evitar futuros problemas en los pies. Cmo cuidar los pies Higiene de los pies  [employer] pies todos los [address]  [address] con agua tibia y un [place]. No use agua caliente. Luego squese los pies y [employer] dedos dando palmaditas hasta que estn completamente secos. No ponga los pies en remojo. Esto puede secarle la piel. Crtese las uas de los pies en lnea recta. No escarbe debajo de las uas o alrededor [employer]. Lime los bordes de las uas con una lima o esmeril. Aplique una locin hidratante o vaselina en la piel de los pies y en las uas secas y [place]. Use una locin que no contenga alcohol ni fragancias. No aplique locin entre los dedos. Zapatos y calcetines Use calcetines de algodn o medias limpias todos los [place]. Asegrese de que no le [employer] No use medias hasta la rodilla. Estas pueden reducir el flujo de sangre a las piernas. Use zapatos que le queden bien y que sean acolchados. Revise siempre los zapatos antes de ponrselos para asegurarse de que no haya objetos en su interior. Para amoldar los zapatos, clcelos solo algunas horas por da. Esto evitar lesiones en los pies. Heridas, rasguos, durezas y callosidades  Controle sus pies diariamente para observar si hay  ampollas, cortes, moretones, llagas o enrojecimiento. Si no puede ver la planta del pie, use un espejo o pdale ayuda a Engineer, maintenance (IT). No corte las durezas o callosidades, ni trate de quitarlas con medicamentos. Si algo le ha raspado, cortado o lastimado la piel de los pies, mantenga la piel de esa zona limpia y [place]. Puede higienizar estas zonas con agua y un jabn suave. No limpie la zona con agua oxigenada, alcohol ni yodo. Si tiene una herida, un rasguo, una dureza o una callosidad en el pie, revsela varias veces al da para asegurarse de que se est curando y no se infecte. Est atento a los siguientes signos: Enrojecimiento, Optician, dispensing. Lquido o sangre. Calor. Pus o mal olor. Consejos generales No se cruce de piernas. Esto puede disminuir el flujo de sangre a los pies. No use bolsas de agua caliente ni almohadillas trmicas en los pies. Podran causar quemaduras. Si ha perdido la sensibilidad en los pies o las piernas, no sabr lo que le est sucediendo hasta que sea demasiado tarde. Proteja sus pies del calor y del fro con calzado, en la playa o sobre el pavimento caliente. Programe una cita para un examen completo de los pies por lo menos una vez al ao o con ms frecuencia si tiene [employer]. Informe todos los cortes, llagas o moretones a su mdico de inmediato. Dnde obtener ms informacin American Diabetes Association (Asociacin Estadounidense de la Diabetes): diabetes.org Association of Diabetes Care & Education Specialists (Asociacin de Especialistas en Atencin y Educacin sobre la Diabetes): diabeteseducator.org Comunquese con un  mdico si: Tiene una afeccin que aumenta su riesgo de tener infecciones y tiene cortes, llagas o moretones en los pies. Tiene una lesin que no se [place]. Tiene una zona irritada en las piernas o los pies. Siente una sensacin de ardor u hormigueo en las piernas o los pies. Siente dolor o calambres en las piernas o los pies. Las  piernas o los pies estn adormecidos. Siente los pies siempre fros. Siente dolor alrededor de [place] ua del pie. Solicite ayuda de inmediato si: Tiene una herida, un rasguo, una dureza o una callosidad en el pie y: [place]e Deer signos de infeccin. Tiene fiebre. Tiene una lnea roja que sube por la pierna. Esta informacin no tiene Theme park manager el consejo del mdico. Asegrese de hacerle al mdico cualquier pregunta que tenga. Document Revised: 12/23/2021 Document Reviewed: 12/23/2021 Elsevier Patient Education  2024 [employer].

## 2024-06-06 ENCOUNTER — Ambulatory Visit: Payer: Self-pay | Admitting: Family Medicine

## 2024-06-06 LAB — CMP14+EGFR
ALT: 31 IU/L (ref 0–32)
AST: 16 IU/L (ref 0–40)
Albumin: 4.4 g/dL (ref 3.9–4.9)
Alkaline Phosphatase: 76 IU/L (ref 49–135)
BUN/Creatinine Ratio: 24 (ref 12–28)
BUN: 14 mg/dL (ref 8–27)
Bilirubin Total: 0.2 mg/dL (ref 0.0–1.2)
CO2: 22 mmol/L (ref 20–29)
Calcium: 9.4 mg/dL (ref 8.7–10.3)
Chloride: 102 mmol/L (ref 96–106)
Creatinine, Ser: 0.59 mg/dL (ref 0.57–1.00)
Globulin, Total: 2.8 g/dL (ref 1.5–4.5)
Glucose: 92 mg/dL (ref 70–99)
Potassium: 4 mmol/L (ref 3.5–5.2)
Sodium: 138 mmol/L (ref 134–144)
Total Protein: 7.2 g/dL (ref 6.0–8.5)
eGFR: 100 mL/min/1.73

## 2024-06-20 ENCOUNTER — Other Ambulatory Visit: Payer: Self-pay

## 2024-06-22 ENCOUNTER — Ambulatory Visit: Payer: Self-pay | Attending: Family Medicine | Admitting: Pharmacist

## 2024-06-22 ENCOUNTER — Encounter: Payer: Self-pay | Admitting: Pharmacist

## 2024-06-22 DIAGNOSIS — Z7984 Long term (current) use of oral hypoglycemic drugs: Secondary | ICD-10-CM

## 2024-06-22 DIAGNOSIS — E119 Type 2 diabetes mellitus without complications: Secondary | ICD-10-CM

## 2024-06-22 LAB — POCT GLYCOSYLATED HEMOGLOBIN (HGB A1C): HbA1c, POC (controlled diabetic range): 6.8 % (ref 0.0–7.0)

## 2024-06-22 NOTE — Progress Notes (Signed)
" ° ° °  Deborah Lyons    TranslatorBETHA Lyons # [phone]  64 y.o. female who presents for diabetes evaluation, education, and management. PMH is significant for T2DM, HTN, HLD, hx Hep C. Of note, I have seen Deborah before. She completed [name] from 10/2022 - 04/2023 with an overall A1c improvement from 8.7 to 6.9%.  Since then, I've continued to see Deborah. Deborah PCP last saw and referred Deborah on 06/05/2024. I last saw Deborah on 03/16/2024. Deborah A1c at that visit with me was 6.7%.  Today, pt presents in good spirits. A1c is 6.8%. Reports adherence to metformin  500 mg tablets - takes 2 tablets (1000mg  total) BID as instructed. No GI upset or diarrhea. She has no complaints regarding Deborah DM.   Family/Social History:  Fhx: HTN, stroke, cancer Tobacco: never smoker  Insurance coverage: [place] Medicaid   Current diabetes medications include: metformin  1000 mg BID (is taking two of the 500 mg immediate release tablets BID).  Current hypertension medications include: amlodipine  5 mg Current hyperlipidemia medications include: atorvastatin  20 mg  Patient denies hypoglycemic events.  Patient denies nocturia (nighttime urination).  Patient denies neuropathy (nerve pain). Patient denies visual changes. Patient reports self foot exams.   Patient reported dietary habits: Eats 2-3 meals/day -Breakfast: 2 cookies, little bit of milk, yogurt -Continuing to try to limit carbs, starches, and sweets. -Deborah Lyons is helping Deborah track Deborah diet better   Patient-reported exercise habits:  - Endorses 2 days of exercise recently by walking around the house for 30 minutes   O:  No GM or CGM present for review today.  Lab Results  Component Value Date   HGBA1C 6.7 03/16/2024   Lipid Panel     Component Value Date/Time   CHOL 161 11/15/2023 0932   TRIG 79 11/15/2023 0932   HDL 63 11/15/2023 0932   CHOLHDL 4.0 11/29/2019 0959   LDLCALC 83 11/15/2023 0932    Clinical Atherosclerotic Cardiovascular Disease (ASCVD): No  The  10-year ASCVD risk score (Arnett DK, et al., 2019) is: 10.8%   Values used to calculate the score:     Age: 6 years     Clinically relevant sex: Female     Is Non-Hispanic African American: No     Diabetic: Yes     Tobacco smoker: No     Systolic Blood Pressure: 122 mmHg     Is BP treated: Yes     HDL Cholesterol: 63 mg/dL     Total Cholesterol: 161 mg/dL   A/P: Diabetes longstanding, currently at goal of <7% based on A1c of 6.8% today. Commended Deborah for this! She is asymptomatic from a hypo- or hyperglycemic standpoint today. Patient is able to verbalize appropriate hypoglycemia management plan. She is adherent to Deborah metformin  and denies any side effects. Will have Deborah continue these and follow-up with PCP. -Continue metformin  1000 mg BID. Pt to take two 500 mg tablets BID. -Refills for all medications given.  -Extensively discussed pathophysiology of diabetes, recommended lifestyle interventions, dietary effects on blood sugar control.  -Counseled on s/sx of and management of hypoglycemia.  -Next A1c anticipated 09/2024.  Written patient instructions provided. Patient verbalized understanding of treatment plan.  Total time in face to face counseling 30 minutes.    Follow-up:  PCP: 12/04/2024  Deborah Lyons, PharmD, BCACP, CPP Clinical Pharmacist [facility] [phone]  "

## 2024-12-04 ENCOUNTER — Ambulatory Visit: Payer: Self-pay | Admitting: Family Medicine
